# Patient Record
Sex: Female | Born: 1965
Health system: Southern US, Community
[De-identification: ages and names within clinical notes are randomized; demographics above are authoritative.]

## PROBLEM LIST (undated history)

## (undated) DIAGNOSIS — G43909 Migraine, unspecified, not intractable, without status migrainosus: Secondary | ICD-10-CM

## (undated) DIAGNOSIS — M1990 Unspecified osteoarthritis, unspecified site: Secondary | ICD-10-CM

## (undated) HISTORY — DX: Migraine, unspecified, not intractable, without status migrainosus: G43.909

## (undated) HISTORY — DX: Unspecified osteoarthritis, unspecified site: M19.90

## (undated) HISTORY — PX: COLONOSCOPY: SHX174

---

## 2001-11-03 HISTORY — PX: PARTIAL HYSTERECTOMY: SHX80

## 2002-08-18 ENCOUNTER — Ambulatory Visit (HOSPITAL_COMMUNITY): Admission: RE | Admit: 2002-08-18 | Discharge: 2002-08-18 | Payer: Self-pay | Admitting: Obstetrics

## 2002-08-18 ENCOUNTER — Encounter (INDEPENDENT_AMBULATORY_CARE_PROVIDER_SITE_OTHER): Payer: Self-pay | Admitting: *Deleted

## 2002-08-19 ENCOUNTER — Encounter: Payer: Self-pay | Admitting: Obstetrics

## 2002-08-19 ENCOUNTER — Ambulatory Visit (HOSPITAL_COMMUNITY): Admission: RE | Admit: 2002-08-19 | Discharge: 2002-08-19 | Payer: Self-pay | Admitting: Obstetrics

## 2002-09-14 ENCOUNTER — Inpatient Hospital Stay (HOSPITAL_COMMUNITY): Admission: RE | Admit: 2002-09-14 | Discharge: 2002-09-17 | Payer: Self-pay | Admitting: Obstetrics

## 2002-09-14 ENCOUNTER — Encounter (INDEPENDENT_AMBULATORY_CARE_PROVIDER_SITE_OTHER): Payer: Self-pay

## 2005-04-24 ENCOUNTER — Encounter: Admission: RE | Admit: 2005-04-24 | Discharge: 2005-04-24 | Payer: Self-pay | Admitting: Obstetrics

## 2006-09-30 ENCOUNTER — Encounter: Admission: RE | Admit: 2006-09-30 | Discharge: 2006-09-30 | Payer: Self-pay | Admitting: Obstetrics

## 2007-11-22 ENCOUNTER — Encounter: Admission: RE | Admit: 2007-11-22 | Discharge: 2007-11-22 | Payer: Self-pay | Admitting: Obstetrics

## 2007-12-01 ENCOUNTER — Encounter: Admission: RE | Admit: 2007-12-01 | Discharge: 2007-12-01 | Payer: Self-pay | Admitting: Obstetrics

## 2008-11-30 ENCOUNTER — Encounter: Admission: RE | Admit: 2008-11-30 | Discharge: 2008-11-30 | Payer: Self-pay | Admitting: Obstetrics

## 2009-12-25 ENCOUNTER — Encounter: Admission: RE | Admit: 2009-12-25 | Discharge: 2009-12-25 | Payer: Self-pay | Admitting: Obstetrics

## 2010-08-23 ENCOUNTER — Ambulatory Visit: Payer: Self-pay | Admitting: Internal Medicine

## 2010-08-23 DIAGNOSIS — M25569 Pain in unspecified knee: Secondary | ICD-10-CM | POA: Insufficient documentation

## 2010-08-23 DIAGNOSIS — G43909 Migraine, unspecified, not intractable, without status migrainosus: Secondary | ICD-10-CM | POA: Insufficient documentation

## 2010-08-23 LAB — CONVERTED CEMR LAB
Alkaline Phosphatase: 52 units/L (ref 39–117)
Basophils Absolute: 0 10*3/uL (ref 0.0–0.1)
Basophils Relative: 0.5 % (ref 0.0–3.0)
Bilirubin, Direct: 0 mg/dL (ref 0.0–0.3)
CO2: 31 meq/L (ref 19–32)
Calcium: 9.8 mg/dL (ref 8.4–10.5)
Creatinine, Ser: 1 mg/dL (ref 0.4–1.2)
Eosinophils Absolute: 0.2 10*3/uL (ref 0.0–0.7)
GFR calc non Af Amer: 81.57 mL/min (ref >60)
HDL: 60.4 mg/dL (ref >39.00)
LDL Cholesterol: 75 mg/dL (ref 0–99)
Lymphocytes Relative: 28.1 % (ref 12.0–46.0)
MCHC: 33.7 g/dL (ref 30.0–36.0)
Monocytes Relative: 6.6 % (ref 3.0–12.0)
Neutrophils Relative %: 63.3 % (ref 43.0–77.0)
RBC: 4.87 M/uL (ref 3.87–5.11)
Specific Gravity, Urine: <=1.005 (ref 1.000–1.030)
Total CHOL/HDL Ratio: 3
Total Protein, Urine: NEGATIVE mg/dL
Triglycerides: 114 mg/dL (ref 0.0–149.0)
Urine Glucose: NEGATIVE mg/dL
VLDL: 22.8 mg/dL (ref 0.0–40.0)

## 2010-12-03 NOTE — Assessment & Plan Note (Signed)
Summary: NEW/ [employer] /[place]  #   Vital Signs:  Patient profile:   45 year old female Height:      70 inches (177.80 cm) Weight:      211.0 pounds (95.91 kg) BMI:     30.38 O2 Sat:      98 % Temp:     99.3 degrees F (37.39 degrees C) oral Pulse rate:   78 / minute BP sitting:   110 / 72  (left arm) Cuff size:   large  Vitals Entered By: Orlan Leavens RMA (August 23, 2010 2:55 PM) CC: New patient Is Patient Diabetic? No Pain Assessment Patient in pain? no        Primary Care Provider:  Newt Lukes MD  CC:  New patient.  History of Present Illness: new pt to me and our practice, here to est care patient is here today for annual physical. Patient feels well today.   c/o knee pain, R>L pain located at top of knee cap pain intermittent but present most days for majority of the time onset 5 years ago - no trauma or precipitating injury symptoms improved with aleve, but doesn't like to take pills no swelling, freq popping pain worse climbing stairs  Preventive Screening-Counseling & Management  Alcohol-Tobacco     Alcohol drinks/day: 0     Alcohol Counseling: not indicated; patient does not drink     Smoking Status: never     Tobacco Counseling: not indicated; no tobacco use  Caffeine-Diet-Exercise     Does Patient Exercise: yes     Exercise Counseling: to improve exercise regimen     Depression Counseling: not indicated; screening negative for depression  Safety-Violence-Falls     Seat Belt Counseling: not indicated; patient wears seat belts     Helmet Counseling: not applicable     Firearm Counseling: not applicable     Violence Counseling: not indicated; no violence risk noted     Fall Risk Counseling: not indicated; no significant falls noted  Clinical Review Panels:  Prevention   Last Mammogram:  No specific mammographic evidence of malignancy.   (11/03/2009)   Last Pap Smear:  Interpretation/ Result:Negative for intraepithelial Lesion or Malignancy.    (11/03/2008)  Immunizations   Last Flu Vaccine:  Historical (08/03/2010)   Current Medications (verified): 1)  Vitamin D 1000 Unit Tabs (Cholecalciferol) .... 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 I have reviewed all other systems and they were negative.   Physical Exam  General:  overweight-appearing.  alert, well-developed, well-nourished, and cooperative to examination.    Head:  Normocephalic and atraumatic without obvious abnormalities. No apparent alopecia or balding. Eyes:  vision grossly intact; pupils equal, round and reactive to light.  conjunctiva and lids normal.    Ears:  normal pinnae bilaterally, without erythema, swelling, or tenderness to  palpation. TMs clear, without effusion, or cerumen impaction. Hearing grossly normal bilaterally  Mouth:  teeth and gums in good repair; mucous membranes moist, without lesions or ulcers. oropharynx clear without exudate, no erythema.  Neck:  supple, full ROM, no masses, no thyromegaly; no thyroid nodules or tenderness. no JVD or carotid bruits.   Lungs:  normal respiratory effort, no intercostal retractions or use of accessory muscles; normal breath sounds bilaterally - no crackles and no wheezes.    Heart:  normal rate, regular rhythm, no murmur, and no rub. BLE without  edema. Abdomen:  soft, non-tender, normal bowel sounds, no distention; no masses and no appreciable hepatomegaly or splenomegaly.   Genitalia:  defer gyn Msk:  both knees: full range of motion, mild boggy synovitis R>L. Tender to palpation along femoralpatellar tendon on R. Increased pain with weight bearing B. Positive crepitus B.  Neurologic:  alert & oriented X3 and cranial nerves II-XII symetrically intact.  strength normal in all extremities, sensation intact to light touch, and gait normal. speech fluent without dysarthria or aphasia; follows commands with good comprehension.  Skin:  no rashes, vesicles, ulcers, or erythema. No nodules or irregularity to palpation.  Psych:  Oriented X3, memory intact for recent and remote, normally interactive, good eye contact, not anxious appearing, mildly depressed appearing, and not agitated.      Impression & Recommendations:  Problem # 1:  PREVENTIVE HEALTH CARE (ICD-V70.0) Patient has been counseled on age-appropriate routine health concerns for screening and prevention. These are reviewed and up-to-date. Immunizations are up-to-date or declined. Labs ordered - to be reviewed. ECG declined Orders: TLB-Lipid Panel (80061-LIPID) TLB-BMP (Basic Metabolic Panel-BMET) (80048-METABOL) TLB-CBC Platelet - w/Differential (85025-CBCD) TLB-Hepatic/Liver Function Pnl (80076-HEPATIC) TLB-TSH (Thyroid Stimulating Hormone) (84443-TSH) TLB-Udip w/ Micro (81001-URINE)  Problem # 2:  KNEE PAIN, BILATERAL (ICD-719.46) R>L knee pain x 4 years - no injury femoral-patellar syndrome, ?tendonitis vs underlying atrhritis - check xray now reviewed poss med options - pt declines pills "and not shots either" - refer PT for strengthening exercises and to cont weight loss efforts at gym Orders: Physical Therapy Referral (PT) T-DG Knee 4V w/ Sunrise/Patella *L* (73564.4) T-DG Knee 4 V w/Sunrise/Patella *R* (16109.6)  Complete Medication List: 1)  Vitamin D 1000 Unit  Tabs (Cholecalciferol) .... Take 1 by mouth once daily  Patient Instructions: 1)  it was good to see you today. 2)  blood and xray test(s) ordered today - your results will be posted on the phone tree for review in 48-72 hours from the time of test completion; call [phone] and enter your 9 digit MRN (listed above on this page, just below your name); if any changes need to be made or there are abnormal results, you will be contacted directly. 3)  we'll make referral to physical therapy for your knee pain and strengthening. Our office will contact you regarding this appointment once made.  4)  ok to continue using Aleve or tylenol as needed for flares of knee pain as discussed  5)  if continued knee pain despite therapy, please call for recheck and discussion of options 6)  Please schedule a follow-up appointment annually for medical physical and labs, sooner if problems.    Orders Added: 1)  TLB-Lipid Panel [80061-LIPID] 2)  TLB-BMP (Basic Metabolic Panel-BMET) [80048-METABOL] 3)  TLB-CBC Platelet - w/Differential [85025-CBCD] 4)  TLB-Hepatic/Liver Function Pnl [80076-HEPATIC] 5)  TLB-TSH (Thyroid Stimulating Hormone) [84443-TSH] 6)  TLB-Udip w/ Micro [81001-URINE] 7)  Physical Therapy Referral [PT] 8)  T-DG Knee 4V  w/ Sunrise/Patella *L* [73564.4] 9)  T-DG Knee 4 V w/Sunrise/Patella *R* [73564.5] 10)  New Patient 40-64 years [99386] 63)  New Patient Level II [99202]   Immunization History:  Influenza Immunization History:    Influenza:  historical (08/03/2010)   Immunization History:  Influenza Immunization History:    Influenza:  Historical (08/03/2010)   Pap Smear  Procedure date:  11/03/2008  Findings:      Interpretation/ Result:Negative for intraepithelial Lesion or Malignancy.     Mammogram  Procedure date:  11/03/2009  Findings:      No specific mammographic evidence of malignancy.

## 2011-01-23 ENCOUNTER — Other Ambulatory Visit: Payer: Self-pay | Admitting: Obstetrics

## 2011-01-23 DIAGNOSIS — Z1231 Encounter for screening mammogram for malignant neoplasm of breast: Secondary | ICD-10-CM

## 2011-01-31 ENCOUNTER — Ambulatory Visit
Admission: RE | Admit: 2011-01-31 | Discharge: 2011-01-31 | Disposition: A | Discharge status: Home / Self Care | Payer: BC Managed Care – PPO | Source: Ambulatory Visit | Attending: Obstetrics | Admitting: Obstetrics

## 2011-01-31 DIAGNOSIS — Z1231 Encounter for screening mammogram for malignant neoplasm of breast: Secondary | ICD-10-CM

## 2011-02-26 ENCOUNTER — Ambulatory Visit: Payer: BC Managed Care – PPO | Admitting: Internal Medicine

## 2011-03-21 NOTE — Discharge Summary (Signed)
   NAME:  Debra Silva, Debra Silva                           ACCOUNT NO.:  [id number]   MEDICAL RECORD NO.:  [id number]                   PATIENT TYPE:  INP   LOCATION:  9303                                 FACILITY:  [facility]   PHYSICIAN:  Kathreen Cosier, M.D.           DATE OF BIRTH:  October 28, 1966   DATE OF ADMISSION:  09/14/2002  DATE OF DISCHARGE:                                 DISCHARGE SUMMARY   HOSPITAL COURSE:  The patient is a 45 year old female with fibroids, myoma  uteri, dysfunctional uterine bleeding.  Was admitted for TAH.  She underwent  TAH and bilateral salpingectomy.  Blood loss 200 cc.  Postoperatively she  did well except for a low-grade temperature.  On postoperative day #2 she  was given Cleocin 300 p.o. q.6h. and on the morning of discharge her  temperature was 100.3.  She was passing gas, incision was clean and dry, she  was asymptomatic.   DISPOSITION:  She was discharged home on Tylox one q.3-4h p.r.n. and Cleocin  300 mg p.o. q.6h. for five days.  Hemoglobin 11.4.  She was instructed that if her temperature got to 101 or greater to come  back to the hospital.   LABORATORY DATA:  On admission her hemoglobin was 13.7, white count 8.6;  postoperative hemoglobin 11.1, white count 15.9 on day #1.  PT/PTT normal.  Sodium 143, potassium 4, chloride 105, creatinine 1.  Urinalysis negative.  The patient was O positive.   DISCHARGE DIAGNOSIS:  Status post total abdominal hysterectomy/bilateral  salpingectomy for myoma uteri.                                               Kathreen Cosier, M.D.    BAM/MEDQ  D:  09/17/2002  T:  09/17/2002  Job:  [id number]

## 2011-03-21 NOTE — Op Note (Signed)
   NAME:  IZETTA, SAKAMOTO                           ACCOUNT NO.:  [id number]   MEDICAL RECORD NO.:  [id number]                   PATIENT TYPE:  AMB   LOCATION:  SDC                                  FACILITY:  [facility]   PHYSICIAN:  Kathreen Cosier, M.D.           DATE OF BIRTH:  1966/10/02   DATE OF PROCEDURE:  08/18/2002  DATE OF DISCHARGE:  08/18/2002                                 OPERATIVE REPORT   PREOPERATIVE DIAGNOSES:  Dysfunctional uterine bleeding.   PROCEDURE:  Diagnostic dilatation and curettage.  Using MAC the patient is  in lithotomy position.  Perineum and vagina prepped and draped.  Bladder  emptied with a straight catheter.  Bimanual examination revealed large  myomas.  A weighted speculum placed in the vagina.  Anterior lip of the  cervix grasped with a tenaculum.  Endometrial cavity sounded 9 cm.  Using a  small curette, the endocervix was curetted a small amount of tissue.  Cervix  was dilated to a number 29 Pratt and then the endometrial cavity curetted.  Large amount of tissue obtained.  Polyp forceps inserted.  No polyps  obtained.  The patient tolerated the procedure well.  Taken to the recovery  room in good condition.                                               Kathreen Cosier, M.D.    BAM/MEDQ  D:  10/14/2002  T:  10/14/2002  Job:  [id number]

## 2011-03-21 NOTE — Op Note (Signed)
NAME:  Debra Silva, Debra Silva                           ACCOUNT NO.:  [id number]   MEDICAL RECORD NO.:  [id number]                   PATIENT TYPE:   LOCATION:                                       FACILITY:  [facility]   PHYSICIAN:  Kathreen Cosier, M.D.           DATE OF BIRTH:  [birth year]   DATE OF PROCEDURE:  09/14/2002  DATE OF DISCHARGE:                                 OPERATIVE REPORT   PREOPERATIVE DIAGNOSES:  Leiomyoma uteri.   PROCEDURE:  Total abdominal hysterectomy, bilateral salpingectomy.   SURGEON:  Kathreen Cosier, M.D.   FIRST ASSISTANT:  Charles A. Clearance Coots, M.D.   ANESTHESIA:  General.   PROCEDURE:  The patient placed on the operating table in supine position.  General anesthesia administered.  Abdomen prepped and draped.  Bladder  emptied with Foley catheter.  Transverse suprapubic incision made.  Carried  down to the rectus fascia.  Fascia cleaned and incised the length of the  incision and recti muscles retracted laterally.  Peritoneum incised  longitudinally.  She had multiple myomas.  Ovaries normal.  Tubes previously  ligated.  Right round ligament was grasped with Kelly clamp, cut, suture  ligated with #1 chromic.  Procedure done in a similar fashion on the other  side.  Using the Metzenbaum scissors the bladder flap was dissected from the  cervix.  The right utero-ovarian ligament was grasped with a Kelly clip,  suture ligated with #1 chronic.  Procedure done in a similar fashion on the  other side.  The uterine vessels were skeletonized bilaterally, double  clamped with Heaney clamps on the right, cut, suture ligated with #1  chromic.  Procedure done in similar fashion on other side.  Straight Kocher  clamps were used to grasp the cardinal and uterosacral ligaments on the  right, cut, suture ligated with #1 chromic and done in a similar fashion  other side.  Specimen consisting of the uterus was removed at the  cervicovaginal junction.  Modified Richardson  sutures placed in the angles  of the vagina and the vaginal vault running with interlocking suture of #1  chromic.  Hemostasis was satisfactory.  The right remnant of tube was  grasped with a Kelly clamp, cut, suture ligated with #1 chronic.  Procedure  done in a similar fashion the other side.  Ovaries were left behind.  Operative site was reperitonealized with 2-0 chromic.  Lap and sponge counts  correct.  Blood loss 200 cc.  Abdomen closed in layers.  Peritoneum  continuous suture of 0 chromic.  Fascia continuous suture of 0 Dexon.  Skin  closed with subcuticular stitch of 3-0 plain.  Blood loss 200 cc.  The  patient tolerated procedure well.  Taken to recovery room in good condition.  Kathreen Cosier, M.D.    BAM/MEDQ  D:  09/14/2002  T:  09/14/2002  Job:  [id number]

## 2011-06-23 ENCOUNTER — Encounter: Payer: Self-pay | Admitting: Internal Medicine

## 2011-06-23 ENCOUNTER — Other Ambulatory Visit (HOSPITAL_COMMUNITY): Payer: Self-pay | Admitting: Obstetrics

## 2011-06-26 ENCOUNTER — Ambulatory Visit (INDEPENDENT_AMBULATORY_CARE_PROVIDER_SITE_OTHER): Payer: BC Managed Care – PPO | Admitting: Internal Medicine

## 2011-06-26 ENCOUNTER — Encounter: Payer: Self-pay | Admitting: Internal Medicine

## 2011-06-26 DIAGNOSIS — K625 Hemorrhage of anus and rectum: Secondary | ICD-10-CM

## 2011-06-26 DIAGNOSIS — R1031 Right lower quadrant pain: Secondary | ICD-10-CM

## 2011-06-26 MED ORDER — DOCUSATE SODIUM 100 MG PO CAPS: 100.0000 mg | ORAL_CAPSULE | Freq: Every day | ORAL | Status: AC | PRN

## 2011-06-26 MED ORDER — PEG-KCL-NACL-NASULF-NA ASC-C 100 G PO SOLR: 1.0000 | ORAL | Status: DC

## 2011-06-26 NOTE — Patient Instructions (Signed)
You have been scheduled for a colonoscopy instructions have been provided. Your prescriptions have been sent to the pharmacy.

## 2011-06-26 NOTE — Progress Notes (Signed)
Subjective:    Patient ID: Debra Silva, female    DOB: 1966-06-21, 45 y.o.   MRN: [id number]  HPI Debra Silva is a 45 year old female with a history of uterine fibroids status post hysterectomy who presents for evaluation of lower abdominal pain.  The patient reports one to 2 months of lower abdominal cramping which is more pronounced on the right. She reports this pain feels like a cramping or spasm. She also refers to it as "nagging". She reports it feels just like her previous uterine fibroid pain prior to her hysterectomy (hysterectomy 8 years ago). The pain occurs roughly every 3 days in the last 3-4 days without remitting. There is no specific aggravating or alleviating factor. She has not tried any medication for this pain. The pain does not radiate. There is no associated nausea or vomiting. Her appetite is good and her weight has been stable. She reports her bowel movements are now once per day for about the past year, but prior to this she was going every 2-3 days for many years. Of late she has seen some rectal bleeding with passing stool. Her bowel movements are not painful, but she does note that they're often hard. The bleeding occurs one or 2 times per month but is a change for her. She's had no fevers chills or night sweats.  Of note she notes that her son has a history of colon polyps and gets annual colonoscopy. She also notes that her son's father also had issues with colon polyps and has undergone colon resection. She is unclear as to their diagnosis.   Review of Systems ROS Constitutional: Negative for fever, chills, night sweats, activity change, appetite change and unexpected weight change HEENT: Negative for sore throat, mouth sores and trouble swallowing. Eyes: Negative for visual disturbance Respiratory: Negative for cough, chest tightness and shortness of breath Cardiovascular: Negative for chest pain, palpitations and lower extremity swelling Gastrointestinal: See  history of present illness Genitourinary: Negative for dysuria and hematuria. Musculoskeletal: + back pain, no arthralgias or myalgias Skin: Negative for rash or color change Neurological: Occasional headaches, no weakness, numbness Hematological: Negative for adenopathy, negative for easy bruising/bleeding Psychiatric/behavioral: Negative for depressed mood, negative for anxiety  Patient Active Problem List  Diagnoses  . MIGRAINE HEADACHE  . KNEE PAIN, BILATERAL    Current outpatient prescriptions:Ascorbic Acid (VITAMIN C) 1000 MG tablet, Take 1,000 mg by mouth daily.  , Disp: , Rfl: ;  cholecalciferol (VITAMIN D) 1000 UNITS tablet, Take 1,000 Units by mouth daily.  , Disp: , Rfl: ;  docusate sodium (COLACE) 100 MG capsule, Take 1 capsule (100 mg total) by mouth daily as needed for constipation., Disp: 30 capsule, Rfl: 11 peg 3350 powder (MOVIPREP) 100 G SOLR, Take 1 kit (100 g total) by mouth as directed. See written handout, Disp: 1 kit, Rfl: 0   Social History  . Marital Status: Single    Number of Children: 1   Occupational History  . [employer]    Social History Main Topics  . Smoking status: Never Smoker   . Smokeless tobacco: Never Used  . Alcohol Use: No  . Drug Use: No   Family History  Problem Relation Age of Onset  . Breast cancer Mother   . Diabetes Maternal Grandmother   . Kidney disease Father    --neg for CRC/polyps      Objective:   Physical Exam BP 120/84  Pulse 60  Ht 5\' 10"  (1.778 m)  Wt 213 lb  9.6 oz (96.888 kg)  BMI 30.65 kg/m2 Constitutional: Well-developed and well-nourished. No distress. HEENT: Normocephalic and atraumatic. Oropharynx is clear and moist. No oropharyngeal exudate. Conjunctivae are normal. Pupils are equal round and reactive to light. No scleral icterus. Neck: Neck supple. Trachea midline. Cardiovascular: Normal rate, regular rhythm and intact distal pulses. No M/R/G Pulmonary/chest: Effort normal and breath sounds normal. No  wheezing, rales or rhonchi. Abdominal: Soft, nontender, nondistended. There are no masses palpable. No hepatosplenomegaly. Lymphadenopathy: No cervical adenopathy noted. Neurological: Alert and oriented to person place and time. Skin: Skin is warm and dry. No rashes noted. Psychiatric: Normal mood and affect. Behavior is normal.    Assessment & Plan:  45 yo female with hx of uterine fibroids status post hysterectomy who presents for evaluation of lower abdominal pain.  1. Abd pain - etiology the patient's pain at this time is unclear however she is scheduled for a pelvic ultrasound tomorrow. This information could be helpful as certainly ovarian cysts or other pathology could cause lower abdominal pain.  Given her history of BRBPR I will schedule her for colonoscopy. Also I will advised that she start docusate 100 mg daily in an attempt to soften her stool. It seems that her stools are hard and certainly some level of constipation could be causing lower abdominal cramping and spasm. We can evaluate her response to this medication a colonoscopy. Followup to be determined after colonoscopy and results of upcoming ultrasound.

## 2011-06-27 ENCOUNTER — Ambulatory Visit (HOSPITAL_COMMUNITY)
Admission: RE | Admit: 2011-06-27 | Discharge: 2011-06-27 | Disposition: A | Discharge status: Home / Self Care | Payer: BC Managed Care – PPO | Source: Ambulatory Visit | Attending: Obstetrics | Admitting: Obstetrics

## 2011-06-27 DIAGNOSIS — N949 Unspecified condition associated with female genital organs and menstrual cycle: Secondary | ICD-10-CM | POA: Insufficient documentation

## 2011-06-27 DIAGNOSIS — Z90710 Acquired absence of both cervix and uterus: Secondary | ICD-10-CM | POA: Insufficient documentation

## 2011-07-03 ENCOUNTER — Other Ambulatory Visit (HOSPITAL_COMMUNITY): Payer: Self-pay | Admitting: Obstetrics

## 2011-07-03 DIAGNOSIS — N83209 Unspecified ovarian cyst, unspecified side: Secondary | ICD-10-CM

## 2011-07-22 ENCOUNTER — Encounter: Payer: Self-pay | Admitting: Internal Medicine

## 2011-07-22 ENCOUNTER — Ambulatory Visit (AMBULATORY_SURGERY_CENTER): Payer: BC Managed Care – PPO | Admitting: Internal Medicine

## 2011-07-22 DIAGNOSIS — R1031 Right lower quadrant pain: Secondary | ICD-10-CM

## 2011-07-22 DIAGNOSIS — K625 Hemorrhage of anus and rectum: Secondary | ICD-10-CM

## 2011-07-22 MED ORDER — SODIUM CHLORIDE 0.9 % IV SOLN: 500.0000 mL | INTRAVENOUS | Status: DC

## 2011-07-22 NOTE — Patient Instructions (Signed)
NORMAL COLON WITH SMALL INTERNAL HEMORRHOIDS- HANDOUT GIVEN ON HEMORRHOIDS  DISCHARGE INSTRUCTIONS GIVEN PER BLUE AND GREEN SHEETS  RETURN TO THE CARE OF YOUR PRIMARY CARE PROVIDER AND GI FOLLOW UP AS NEEDED. PLEASE CALL [phone] IF YOU HAVE ANY ISSUES ARISE OR HAVE ANY QUESTIONS.  CONTINUE COLACE ( DOCUSATE) 100 MG ONCE OR TWICE  DAILY AS NEEDED TO SOFTEN STOOLS/ AVOID CONSTIPATION.

## 2011-07-22 NOTE — Progress Notes (Signed)
Per Clide Cliff RN - Pt very nervous before procedure. Pt has hemorrhagic cyst on ovary which is probably causing pain per Dr Rhea Belton.  From 8:10 until 11:00 [employer] computer system wide down, After 11:00 wireless network still not working. Downtime procedure policy followed.  Information entered by Laverna Peace RN, procedure RN Clide Cliff, Recovery Room RN Bufford Spikes.

## 2011-07-23 ENCOUNTER — Telehealth: Payer: Self-pay

## 2011-07-23 NOTE — Telephone Encounter (Signed)
I called the hm # and the answering machine picked up with no ID on the message.  No message was left.  I then called her work # and left a message to call if any questions or concerns.  No need to call if she dose not have any. MAW

## 2011-08-22 ENCOUNTER — Ambulatory Visit (HOSPITAL_COMMUNITY): Payer: BC Managed Care – PPO

## 2011-08-22 ENCOUNTER — Ambulatory Visit (HOSPITAL_COMMUNITY)
Admission: RE | Admit: 2011-08-22 | Discharge: 2011-08-22 | Disposition: A | Discharge status: Home / Self Care | Payer: BC Managed Care – PPO | Source: Ambulatory Visit | Attending: Obstetrics | Admitting: Obstetrics

## 2011-08-22 DIAGNOSIS — Z90710 Acquired absence of both cervix and uterus: Secondary | ICD-10-CM | POA: Insufficient documentation

## 2011-08-22 DIAGNOSIS — N949 Unspecified condition associated with female genital organs and menstrual cycle: Secondary | ICD-10-CM | POA: Insufficient documentation

## 2011-08-22 DIAGNOSIS — N83209 Unspecified ovarian cyst, unspecified side: Secondary | ICD-10-CM | POA: Insufficient documentation

## 2012-03-01 ENCOUNTER — Other Ambulatory Visit: Payer: Self-pay | Admitting: Obstetrics

## 2012-03-01 DIAGNOSIS — Z1231 Encounter for screening mammogram for malignant neoplasm of breast: Secondary | ICD-10-CM

## 2012-03-12 ENCOUNTER — Ambulatory Visit
Admission: RE | Admit: 2012-03-12 | Discharge: 2012-03-12 | Disposition: A | Discharge status: Home / Self Care | Payer: BC Managed Care – PPO | Source: Ambulatory Visit | Attending: Obstetrics | Admitting: Obstetrics

## 2012-03-12 DIAGNOSIS — Z1231 Encounter for screening mammogram for malignant neoplasm of breast: Secondary | ICD-10-CM

## 2013-03-07 ENCOUNTER — Other Ambulatory Visit: Payer: Self-pay

## 2013-03-07 DIAGNOSIS — Z1231 Encounter for screening mammogram for malignant neoplasm of breast: Secondary | ICD-10-CM

## 2013-04-12 ENCOUNTER — Ambulatory Visit
Admission: RE | Admit: 2013-04-12 | Discharge: 2013-04-12 | Disposition: A | Discharge status: Home / Self Care | Payer: BC Managed Care – PPO | Source: Ambulatory Visit

## 2013-04-12 DIAGNOSIS — Z1231 Encounter for screening mammogram for malignant neoplasm of breast: Secondary | ICD-10-CM

## 2013-10-12 ENCOUNTER — Encounter: Payer: Self-pay | Admitting: Internal Medicine

## 2013-10-12 ENCOUNTER — Ambulatory Visit (INDEPENDENT_AMBULATORY_CARE_PROVIDER_SITE_OTHER): Payer: BC Managed Care – PPO | Admitting: Internal Medicine

## 2013-10-12 ENCOUNTER — Other Ambulatory Visit (INDEPENDENT_AMBULATORY_CARE_PROVIDER_SITE_OTHER): Payer: BC Managed Care – PPO

## 2013-10-12 VITALS — BP 110/82 | HR 75 | Temp 98.2°F | Ht 68.0 in | Wt 227.0 lb

## 2013-10-12 DIAGNOSIS — Z0000 Encounter for general adult medical examination without abnormal findings: Secondary | ICD-10-CM

## 2013-10-12 DIAGNOSIS — Z23 Encounter for immunization: Secondary | ICD-10-CM

## 2013-10-12 DIAGNOSIS — E669 Obesity, unspecified: Secondary | ICD-10-CM

## 2013-10-12 LAB — BASIC METABOLIC PANEL
CO2: 29 mEq/L (ref 19–32)
Calcium: 9.5 mg/dL (ref 8.4–10.5)
Chloride: 101 mEq/L (ref 96–112)
Glucose, Bld: 89 mg/dL (ref 70–99)
Sodium: 138 mEq/L (ref 135–145)

## 2013-10-12 LAB — HEPATIC FUNCTION PANEL
AST: 23 U/L (ref 0–37)
Albumin: 4.1 g/dL (ref 3.5–5.2)
Alkaline Phosphatase: 57 U/L (ref 39–117)
Total Bilirubin: 0.5 mg/dL (ref 0.3–1.2)

## 2013-10-12 LAB — LIPID PANEL
HDL: 61.2 mg/dL (ref >39.00)
LDL Cholesterol: 65 mg/dL (ref 0–99)
Total CHOL/HDL Ratio: 2

## 2013-10-12 LAB — URINALYSIS, ROUTINE W REFLEX MICROSCOPIC
Bilirubin Urine: NEGATIVE
Ketones, ur: NEGATIVE
Leukocytes, UA: NEGATIVE
Urine Glucose: NEGATIVE
Urobilinogen, UA: 0.2 (ref 0.0–1.0)

## 2013-10-12 LAB — CBC WITH DIFFERENTIAL/PLATELET
Basophils Absolute: 0 10*3/uL (ref 0.0–0.1)
Basophils Relative: 0.3 % (ref 0.0–3.0)
Eosinophils Absolute: 0.2 10*3/uL (ref 0.0–0.7)
Hemoglobin: 13.5 g/dL (ref 12.0–15.0)
Lymphocytes Relative: 32.3 % (ref 12.0–46.0)
Lymphs Abs: 3.2 10*3/uL (ref 0.7–4.0)
MCHC: 33.1 g/dL (ref 30.0–36.0)
MCV: 78.9 fl (ref 78.0–100.0)
Monocytes Absolute: 0.6 10*3/uL (ref 0.1–1.0)
Neutro Abs: 5.9 10*3/uL (ref 1.4–7.7)
RBC: 5.16 Mil/uL — ABNORMAL HIGH (ref 3.87–5.11)
RDW: 14.2 % (ref 11.5–14.6)

## 2013-10-12 NOTE — Patient Instructions (Addendum)
It was good to see you today.  We have reviewed your prior records including labs and tests today  Health Maintenance reviewed - all recommended immunizations and age-appropriate screenings are up-to-date.  Test(s) ordered today. Your results will be released to MyChart (or called to you) after review, usually within 72hours after test completion. If any changes need to be made, you will be notified at that same time.  Medications reviewed and updated, no changes recommended at this time.  Work on lifestyle changes as discussed (low fat, low carb, increased protein diet; improved exercise efforts; weight loss) to control sugar, blood pressure and cholesterol levels and/or reduce risk of developing other medical problems. Look into [email or web address] or other type of food journal to assist you in this process.  Please schedule followup in 12-18 months, call sooner if problems.  Health Maintenance, Female A healthy lifestyle and preventative care can promote health and wellness.  Maintain regular health, dental, and eye exams.  Eat a healthy diet. Foods like vegetables, fruits, whole grains, low-fat dairy products, and lean protein foods contain the nutrients you need without too many calories. Decrease your intake of foods high in solid fats, added sugars, and salt. Get information about a proper diet from your caregiver, if necessary.  Regular physical exercise is one of the most important things you can do for your health. Most adults should get at least 150 minutes of moderate-intensity exercise (any activity that increases your heart rate and causes you to sweat) each week. In addition, most adults need muscle-strengthening exercises on 2 or more days a week.   Maintain a healthy weight. The body mass index (BMI) is a screening tool to identify possible weight problems. It provides an estimate of body fat based on height and weight. Your caregiver can help determine your BMI, and can help  you achieve or maintain a healthy weight. For adults 20 years and older:  A BMI below 18.5 is considered underweight.  A BMI of 18.5 to 24.9 is normal.  A BMI of 25 to 29.9 is considered overweight.  A BMI of 30 and above is considered obese.  Maintain normal blood lipids and cholesterol by exercising and minimizing your intake of saturated fat. Eat a balanced diet with plenty of fruits and vegetables. Blood tests for lipids and cholesterol should begin at age 31 and be repeated every 5 years. If your lipid or cholesterol levels are high, you are over 50, or you are a high risk for heart disease, you may need your cholesterol levels checked more frequently.Ongoing high lipid and cholesterol levels should be treated with medicines if diet and exercise are not effective.  If you smoke, find out from your caregiver how to quit. If you do not use tobacco, do not start.  Lung cancer screening is recommended for adults aged 47 80 years who are at high risk for developing lung cancer because of a history of smoking. Yearly low-dose computed tomography (CT) is recommended for people who have at least a 30-pack-year history of smoking and are a current smoker or have quit within the past 15 years. A pack year of smoking is smoking an average of 1 pack of cigarettes a day for 1 year (for example: 1 pack a day for 30 years or 2 packs a day for 15 years). Yearly screening should continue until the smoker has stopped smoking for at least 15 years. Yearly screening should also be stopped for people who develop a health problem that would  prevent them from having lung cancer treatment.  If you are pregnant, do not drink alcohol. If you are breastfeeding, be very cautious about drinking alcohol. If you are not pregnant and choose to drink alcohol, do not exceed 1 drink per day. One drink is considered to be 12 ounces (355 mL) of beer, 5 ounces (148 mL) of wine, or 1.5 ounces (44 mL) of liquor.  Avoid use of  street drugs. Do not share needles with anyone. Ask for help if you need support or instructions about stopping the use of drugs.  High blood pressure causes heart disease and increases the risk of stroke. Blood pressure should be checked at least every 1 to 2 years. Ongoing high blood pressure should be treated with medicines, if weight loss and exercise are not effective.  If you are 59 to 46 years old, ask your caregiver if you should take aspirin to prevent strokes.  Diabetes screening involves taking a blood sample to check your fasting blood sugar level. This should be done once every 3 years, after age 32, if you are within normal weight and without risk factors for diabetes. Testing should be considered at a younger age or be carried out more frequently if you are overweight and have at least 1 risk factor for diabetes.  Breast cancer screening is essential preventative care for women. You should practice "breast self-awareness." This means understanding the normal appearance and feel of your breasts and may include breast self-examination. Any changes detected, no matter how small, should be reported to a caregiver. Women in their 69s and 30s should have a clinical breast exam (CBE) by a caregiver as part of a regular health exam every 1 to 3 years. After age 64, women should have a CBE every year. Starting at age 50, women should consider having a mammogram (breast X-ray) every year. Women who have a family history of breast cancer should talk to their caregiver about genetic screening. Women at a high risk of breast cancer should talk to their caregiver about having an MRI and a mammogram every year.  Breast cancer gene (BRCA)-related cancer risk assessment is recommended for women who have family members with BRCA-related cancers. BRCA-related cancers include breast, ovarian, tubal, and peritoneal cancers. Having family members with these cancers may be associated with an increased risk for  harmful changes (mutations) in the breast cancer genes BRCA1 and BRCA2. Results of the assessment will determine the need for genetic counseling and BRCA1 and BRCA2 testing.  The Pap test is a screening test for cervical cancer. Women should have a Pap test starting at age 9. Between ages 72 and 80, Pap tests should be repeated every 2 years. Beginning at age 73, you should have a Pap test every 3 years as long as the past 3 Pap tests have been normal. If you had a hysterectomy for a problem that was not cancer or a condition that could lead to cancer, then you no longer need Pap tests. If you are between ages 25 and 77, and you have had normal Pap tests going back 10 years, you no longer need Pap tests. If you have had past treatment for cervical cancer or a condition that could lead to cancer, you need Pap tests and screening for cancer for at least 20 years after your treatment. If Pap tests have been discontinued, risk factors (such as a new sexual partner) need to be reassessed to determine if screening should be resumed. Some women have medical  problems that increase the chance of getting cervical cancer. In these cases, your caregiver may recommend more frequent screening and Pap tests.  The human papillomavirus (HPV) test is an additional test that may be used for cervical cancer screening. The HPV test looks for the virus that can cause the cell changes on the cervix. The cells collected during the Pap test can be tested for HPV. The HPV test could be used to screen women aged 44 years and older, and should be used in women of any age who have unclear Pap test results. After the age of 77, women should have HPV testing at the same frequency as a Pap test.  Colorectal cancer can be detected and often prevented. Most routine colorectal cancer screening begins at the age of 44 and continues through age 38. However, your caregiver may recommend screening at an earlier age if you have risk factors for  colon cancer. On a yearly basis, your caregiver may provide home test kits to check for hidden blood in the stool. Use of a small camera at the end of a tube, to directly examine the colon (sigmoidoscopy or colonoscopy), can detect the earliest forms of colorectal cancer. Talk to your caregiver about this at age 62, when routine screening begins. Direct examination of the colon should be repeated every 5 to 10 years through age 49, unless early forms of pre-cancerous polyps or small growths are found.  Hepatitis C blood testing is recommended for all people born from [birth year] through 1964 and any individual with known risks for hepatitis C.  Practice safe sex. Use condoms and avoid high-risk sexual practices to reduce the spread of sexually transmitted infections (STIs). Sexually active women aged 103 and younger should be checked for Chlamydia, which is a common sexually transmitted infection. Older women with new or multiple partners should also be tested for Chlamydia. Testing for other STIs is recommended if you are sexually active and at increased risk.  Osteoporosis is a disease in which the bones lose minerals and strength with aging. This can result in serious bone fractures. The risk of osteoporosis can be identified using a bone density scan. Women ages 76 and over and women at risk for fractures or osteoporosis should discuss screening with their caregivers. Ask your caregiver whether you should be taking a calcium supplement or vitamin D to reduce the rate of osteoporosis.  Menopause can be associated with physical symptoms and risks. Hormone replacement therapy is available to decrease symptoms and risks. You should talk to your caregiver about whether hormone replacement therapy is right for you.  Use sunscreen. Apply sunscreen liberally and repeatedly throughout the day. You should seek shade when your shadow is shorter than you. Protect yourself by wearing long sleeves, pants, a wide-brimmed  hat, and sunglasses year round, whenever you are outdoors.  Notify your caregiver of new moles or changes in moles, especially if there is a change in shape or color. Also notify your caregiver if a mole is larger than the size of a pencil eraser.  Stay current with your immunizations. Document Released: 05/05/2011 Document Revised: 02/14/2013 Document Reviewed: 05/05/2011 [facility] Patient Information 2014 [place], [place]. Exercise to Lose Weight Exercise and a healthy diet may help you lose weight. Your doctor may suggest specific exercises. EXERCISE IDEAS AND TIPS  Choose low-cost things you enjoy doing, such as walking, bicycling, or exercising to workout videos.  Take stairs instead of the elevator.  Walk during your lunch break.  Park your car  further away from work or school.  Go to a gym or an exercise class.  Start with 5 to 10 minutes of exercise each day. Build up to 30 minutes of exercise 4 to 6 days a week.  Wear shoes with good support and comfortable clothes.  Stretch before and after working out.  Work out until you breathe harder and your heart beats faster.  Drink extra water when you exercise.  Do not do so much that you hurt yourself, feel dizzy, or get very short of breath. Exercises that burn about 150 calories:  Running 1  miles in 15 minutes.  Playing volleyball for 45 to 60 minutes.  Washing and waxing a car for 45 to 60 minutes.  Playing touch football for 45 minutes.  Walking 1  miles in 35 minutes.  Pushing a stroller 1  miles in 30 minutes.  Playing basketball for 30 minutes.  Raking leaves for 30 minutes.  Bicycling 5 miles in 30 minutes.  Walking 2 miles in 30 minutes.  Dancing for 30 minutes.  Shoveling snow for 15 minutes.  Swimming laps for 20 minutes.  Walking up stairs for 15 minutes.  Bicycling 4 miles in 15 minutes.  Gardening for 30 to 45 minutes.  Jumping rope for 15 minutes.  Washing windows or floors for  45 to 60 minutes. Document Released: 11/22/2010 Document Revised: 01/12/2012 Document Reviewed: 11/22/2010 [facility] Patient Information 2014 [place], [place].

## 2013-10-12 NOTE — Assessment & Plan Note (Signed)
Wt Readings from Last 3 Encounters:  10/12/13 227 lb (102.967 kg)  07/22/11 213 lb (96.616 kg)  06/26/11 213 lb 9.6 oz (96.888 kg)   The patient is asked to make an attempt to improve diet and exercise patterns to aid in medical management of this problem.

## 2013-10-12 NOTE — Progress Notes (Signed)
   Subjective:    Patient ID: Debra Silva, female    DOB: [birth year], 47 y.o.   MRN: [id number]  HPI  "New" patient - last seen 08/2010  patient is here today for annual physical. Patient feels well and has no complaints.  Past Medical History  Diagnosis Date  . Migraine    Family History  Problem Relation Age of Onset  . Breast cancer Mother 73  . Diabetes Maternal Grandmother   . Kidney disease Father   . ALS Maternal Grandmother 24  . Stroke Father   . Hypertension Brother    History  Substance Use Topics  . Smoking status: Never Smoker   . Smokeless tobacco: Never Used  . Alcohol Use: No    Review of Systems  Constitutional: Negative for fatigue and unexpected weight change.  Respiratory: Negative for cough, shortness of breath and wheezing.   Cardiovascular: Negative for chest pain, palpitations and leg swelling.  Gastrointestinal: Negative for nausea, abdominal pain and diarrhea.  Neurological: Negative for dizziness, weakness, light-headedness and headaches.  Psychiatric/Behavioral: Negative for dysphoric mood. The patient is not nervous/anxious.   All other systems reviewed and are negative.       Objective:   Physical Exam BP 110/82  Pulse 75  Temp(Src) 98.2 F (36.8 C) (Oral)  Ht 5\' 8"  (1.727 m)  Wt 227 lb (102.967 kg)  BMI 34.52 kg/m2  SpO2 97% Wt Readings from Last 3 Encounters:  10/12/13 227 lb (102.967 kg)  07/22/11 213 lb (96.616 kg)  06/26/11 213 lb 9.6 oz (96.888 kg)   Constitutional: She is overweight, but appears well-developed and well-nourished. No distress.  HENT: Head: Normocephalic and atraumatic. Ears: B TMs ok, no erythema or effusion; Nose: Nose normal. Mouth/Throat: Oropharynx is clear and moist. No oropharyngeal exudate.  Eyes: Conjunctivae and EOM are normal. Pupils are equal, round, and reactive to light. No scleral icterus.  Neck: Normal range of motion. Neck supple. No JVD present. No thyromegaly present.  Cardiovascular:  Normal rate, regular rhythm and normal heart sounds.  No murmur heard. No BLE edema. Pulmonary/Chest: Effort normal and breath sounds normal. No respiratory distress. She has no wheezes.  Abdominal: Soft. Bowel sounds are normal. She exhibits no distension. There is no tenderness. no masses Musculoskeletal: Normal range of motion, no joint effusions. No gross deformities Neurological: She is alert and oriented to person, place, and time. No cranial nerve deficit. Coordination, balance, strength, speech and gait are normal.  Skin: Skin is warm and dry. No rash noted. No erythema.  Psychiatric: She has a normal mood and affect. Her behavior is normal. Judgment and thought content normal.   Lab Results  Component Value Date   WBC 10.0 08/23/2010   HGB 13.3 08/23/2010   HCT 39.5 08/23/2010   PLT 245.0 08/23/2010   GLUCOSE 85 08/23/2010   CHOL 158 08/23/2010   TRIG 114.0 08/23/2010   HDL 60.40 08/23/2010   LDLCALC 75 08/23/2010   ALT 12 08/23/2010   AST 16 08/23/2010   NA 140 08/23/2010   K 5.0 08/23/2010   CL 104 08/23/2010   CREATININE 1.0 08/23/2010   BUN 7 08/23/2010   CO2 31 08/23/2010   TSH 1.82 08/23/2010        Assessment & Plan:   CPX/v70.0 - Patient has been counseled on age-appropriate routine health concerns for screening and prevention. These are reviewed and up-to-date. Immunizations are up-to-date or declined. Labs ordered and reviewed.

## 2013-10-12 NOTE — Progress Notes (Signed)
Pre-visit discussion using our clinic review tool. No additional management support is needed unless otherwise documented below in the visit note.  

## 2013-10-17 LAB — HM PAP SMEAR

## 2013-11-08 ENCOUNTER — Ambulatory Visit: Payer: BC Managed Care – PPO

## 2013-11-08 ENCOUNTER — Ambulatory Visit (INDEPENDENT_AMBULATORY_CARE_PROVIDER_SITE_OTHER): Payer: BC Managed Care – PPO | Admitting: Emergency Medicine

## 2013-11-08 VITALS — BP 136/80 | HR 91 | Temp 98.1°F | Resp 18 | Ht 68.0 in | Wt 224.0 lb

## 2013-11-08 DIAGNOSIS — M25551 Pain in right hip: Secondary | ICD-10-CM

## 2013-11-08 DIAGNOSIS — M25559 Pain in unspecified hip: Secondary | ICD-10-CM

## 2013-11-08 MED ORDER — NAPROXEN SODIUM 550 MG PO TABS: 550.0000 mg | ORAL_TABLET | Freq: Two times a day (BID) | ORAL | Status: DC

## 2013-11-08 MED ORDER — ACETAMINOPHEN-CODEINE #3 300-30 MG PO TABS: 1.0000 | ORAL_TABLET | ORAL | Status: DC | PRN

## 2013-11-08 NOTE — Progress Notes (Signed)
[facility] [address] [place] [address] [phone]  Date:  11/08/2013   Name:  Debra Silva   DOB:  1966-01-30   MRN:  [id number]  PCP:  Gwendolyn Grant, MD    Chief Complaint: Hip Pain   History of Present Illness:  Debra Silva is a 48 y.o. very pleasant female patient who presents with the following:  Has pain in right hip for two day.  No history of injury or overuse. No antecedent infection or fever or chills.  Pain increases with standing and walking.  Can't lay on that side. No improvement with over the counter medications or other home remedies. Denies other complaint or health concern today. Works out regularly and walks a treadmil 4 hours weekly  Patient Active Problem List   Diagnosis Date Noted  . Obesity (BMI 30-39.9) 10/12/2013  . MIGRAINE HEADACHE 08/23/2010  . KNEE PAIN, BILATERAL 08/23/2010    Past Medical History  Diagnosis Date  . Migraine     Past Surgical History  Procedure Laterality Date  . Partial hysterectomy    . Abdominal hysterectomy      History  Substance Use Topics  . Smoking status: Never Smoker   . Smokeless tobacco: Never Used  . Alcohol Use: No    Family History  Problem Relation Age of Onset  . Breast cancer Mother 7  . Diabetes Maternal Grandmother   . Kidney disease Father   . ALS Maternal Grandmother 83  . Stroke Father   . Hypertension Brother     No Known Allergies  Medication list has been reviewed and updated.  Current Outpatient Prescriptions on File Prior to Visit  Medication Sig Dispense Refill  . Cholecalciferol (VITAMIN D) 2000 UNITS CAPS Take 1 capsule by mouth daily.       No current facility-administered medications on file prior to visit.    Review of Systems:  As per HPI, otherwise negative.    Physical Examination: Filed Vitals:   11/08/13 1053  BP: 136/80  Pulse: 91  Temp: 98.1 F (36.7 C)  Resp: 18   Filed Vitals:   11/08/13 1053  Height: 5\' 8"   (1.727 m)  Weight: 224 lb (101.606 kg)   Body mass index is 34.07 kg/(m^2). Ideal Body Weight: Weight in (lb) to have BMI = 25: 164.1   GEN: WDWN, NAD, Non-toxic, Alert & Oriented x 3 HEENT: Atraumatic, Normocephalic.  Ears and Nose: No external deformity. EXTR: No clubbing/cyanosis/edema NEURO: Normal gait.  PSYCH: Normally interactive. Conversant. Not depressed or anxious appearing.  Calm demeanor.  RIGHT hip:  No tenderness.  Guards with movement, flexion and internal and external rotation.  Assessment and Plan: Right hip strain T#3 Anaprox   Signed,  Ellison Carwin, MD  [facility] reading (PRIMARY) by  Dr. Ouida Sills  Negative hip.

## 2014-03-16 ENCOUNTER — Other Ambulatory Visit: Payer: Self-pay

## 2014-03-16 DIAGNOSIS — Z1231 Encounter for screening mammogram for malignant neoplasm of breast: Secondary | ICD-10-CM

## 2014-04-14 ENCOUNTER — Ambulatory Visit
Admission: RE | Admit: 2014-04-14 | Discharge: 2014-04-14 | Disposition: A | Discharge status: Home / Self Care | Payer: BC Managed Care – PPO | Source: Ambulatory Visit

## 2014-04-14 DIAGNOSIS — Z1231 Encounter for screening mammogram for malignant neoplasm of breast: Secondary | ICD-10-CM

## 2014-10-25 ENCOUNTER — Ambulatory Visit (INDEPENDENT_AMBULATORY_CARE_PROVIDER_SITE_OTHER): Payer: BC Managed Care – PPO | Admitting: Internal Medicine

## 2014-10-25 ENCOUNTER — Other Ambulatory Visit (INDEPENDENT_AMBULATORY_CARE_PROVIDER_SITE_OTHER): Payer: BC Managed Care – PPO

## 2014-10-25 ENCOUNTER — Encounter: Payer: Self-pay | Admitting: Internal Medicine

## 2014-10-25 VITALS — BP 120/84 | HR 77 | Temp 98.0°F | Ht 68.75 in | Wt 231.0 lb

## 2014-10-25 DIAGNOSIS — E669 Obesity, unspecified: Secondary | ICD-10-CM

## 2014-10-25 DIAGNOSIS — Z0000 Encounter for general adult medical examination without abnormal findings: Secondary | ICD-10-CM

## 2014-10-25 LAB — BASIC METABOLIC PANEL
BUN: 7 mg/dL (ref 6–23)
CALCIUM: 9.8 mg/dL (ref 8.4–10.5)
CO2: 26 meq/L (ref 19–32)
CREATININE: 1 mg/dL (ref 0.4–1.2)
Chloride: 103 mEq/L (ref 96–112)
GFR: 72.99 mL/min (ref >60.00)
Glucose, Bld: 90 mg/dL (ref 70–99)
Potassium: 4.9 mEq/L (ref 3.5–5.1)
Sodium: 138 mEq/L (ref 135–145)

## 2014-10-25 LAB — LIPID PANEL
CHOL/HDL RATIO: 3
Cholesterol: 188 mg/dL (ref 0–200)
HDL: 58.3 mg/dL (ref >39.00)
LDL CALC: 112 mg/dL — AB (ref 0–99)
NONHDL: 129.7
Triglycerides: 87 mg/dL (ref 0.0–149.0)
VLDL: 17.4 mg/dL (ref 0.0–40.0)

## 2014-10-25 LAB — URINALYSIS, ROUTINE W REFLEX MICROSCOPIC
BILIRUBIN URINE: NEGATIVE
KETONES UR: NEGATIVE
Leukocytes, UA: NEGATIVE
Nitrite: NEGATIVE
Specific Gravity, Urine: <=1.005 — AB (ref 1.000–1.030)
TOTAL PROTEIN, URINE-UPE24: NEGATIVE
URINE GLUCOSE: NEGATIVE
UROBILINOGEN UA: 0.2 (ref 0.0–1.0)
pH: 6 (ref 5.0–8.0)

## 2014-10-25 LAB — CBC WITH DIFFERENTIAL/PLATELET
BASOS PCT: 0.3 % (ref 0.0–3.0)
Basophils Absolute: 0 10*3/uL (ref 0.0–0.1)
EOS ABS: 0.1 10*3/uL (ref 0.0–0.7)
Eosinophils Relative: 0.8 % (ref 0.0–5.0)
HCT: 46 % (ref 36.0–46.0)
Hemoglobin: 14.9 g/dL (ref 12.0–15.0)
LYMPHS PCT: 32.4 % (ref 12.0–46.0)
Lymphs Abs: 3.3 10*3/uL (ref 0.7–4.0)
MCHC: 32.4 g/dL (ref 30.0–36.0)
MCV: 79.9 fl (ref 78.0–100.0)
Monocytes Absolute: 0.6 10*3/uL (ref 0.1–1.0)
Monocytes Relative: 6.1 % (ref 3.0–12.0)
NEUTROS PCT: 60.4 % (ref 43.0–77.0)
Neutro Abs: 6.1 10*3/uL (ref 1.4–7.7)
PLATELETS: 294 10*3/uL (ref 150.0–400.0)
RBC: 5.75 Mil/uL — AB (ref 3.87–5.11)
RDW: 14.3 % (ref 11.5–15.5)
WBC: 10 10*3/uL (ref 4.0–10.5)

## 2014-10-25 LAB — HEPATIC FUNCTION PANEL
ALK PHOS: 63 U/L (ref 39–117)
ALT: 15 U/L (ref 0–35)
AST: 16 U/L (ref 0–37)
Albumin: 4.3 g/dL (ref 3.5–5.2)
BILIRUBIN DIRECT: 0.1 mg/dL (ref 0.0–0.3)
BILIRUBIN TOTAL: 0.2 mg/dL (ref 0.2–1.2)
TOTAL PROTEIN: 7.8 g/dL (ref 6.0–8.3)

## 2014-10-25 LAB — TSH: TSH: 4.25 u[IU]/mL (ref 0.35–4.50)

## 2014-10-25 NOTE — Progress Notes (Signed)
Subjective:    Patient ID: Debra Silva, female    DOB: 12-21-1965, 48 y.o.   MRN: [id number]  HPI  patient is here today for annual physical. Patient feels well and has no complaints.  Also reviewed chronic medical issues and interval medical events  Past Medical History  Diagnosis Date  . Migraine     hx of, resolved   Family History  Problem Relation Age of Onset  . Breast cancer Mother 63  . Diabetes Maternal Grandmother   . Kidney disease Father   . ALS Maternal Grandmother 83  . Stroke Father   . Hypertension Brother    History  Substance Use Topics  . Smoking status: Never Smoker   . Smokeless tobacco: Never Used  . Alcohol Use: No    Review of Systems  Constitutional: Negative for fatigue and unexpected weight change.  Respiratory: Negative for cough, shortness of breath and wheezing.   Cardiovascular: Negative for chest pain, palpitations and leg swelling.  Gastrointestinal: Negative for nausea, abdominal pain and diarrhea.  Neurological: Negative for dizziness, weakness, light-headedness and headaches.  Psychiatric/Behavioral: Negative for dysphoric mood. The patient is not nervous/anxious.   All other systems reviewed and are negative.      Objective:   Physical Exam  BP 120/84 mmHg  Pulse 77  Temp(Src) 98 F (36.7 C) (Oral)  Ht 5' 8.75" (1.746 m)  Wt 231 lb (104.781 kg)  BMI 34.37 kg/m2  SpO2 99% Wt Readings from Last 3 Encounters:  10/25/14 231 lb (104.781 kg)  11/08/13 224 lb (101.606 kg)  10/12/13 227 lb (102.967 kg)   Constitutional: She is obese, appears well-developed and well-nourished. No distress.  HENT: Head: Normocephalic and atraumatic. Ears: B TMs ok, no erythema or effusion; Nose: Nose normal. Mouth/Throat: Oropharynx is clear and moist. No oropharyngeal exudate.  Eyes: Conjunctivae and EOM are normal. Pupils are equal, round, and reactive to light. No scleral icterus.  Neck: Normal range of motion. Neck supple. No JVD  present. No thyromegaly present.  Cardiovascular: Normal rate, regular rhythm and normal heart sounds.  No murmur heard. No BLE edema. Pulmonary/Chest: Effort normal and breath sounds normal. No respiratory distress. She has no wheezes.  Abdominal: Soft. Bowel sounds are normal. She exhibits no distension. There is no tenderness. no masses GU/breast: defer to gyn Musculoskeletal: Normal range of motion, no joint effusions. No gross deformities Neurological: She is alert and oriented to person, place, and time. No cranial nerve deficit. Coordination, balance, strength, speech and gait are normal.  Skin: Skin is warm and dry. No rash noted. No erythema.  Psychiatric: She has a normal mood and affect. Her behavior is normal. Judgment and thought content normal.    Lab Results  Component Value Date   WBC 9.9 10/12/2013   HGB 13.5 10/12/2013   HCT 40.8 10/12/2013   PLT 241.0 10/12/2013   GLUCOSE 89 10/12/2013   CHOL 152 10/12/2013   TRIG 128.0 10/12/2013   HDL 61.20 10/12/2013   LDLCALC 65 10/12/2013   ALT 25 10/12/2013   AST 23 10/12/2013   NA 138 10/12/2013   K 4.1 10/12/2013   CL 101 10/12/2013   CREATININE 1.1 10/12/2013   BUN 9 10/12/2013   CO2 29 10/12/2013   TSH 1.57 10/12/2013    Mm Screening Breast Tomo Bilateral  04/18/2014   CLINICAL DATA:  Screening.  EXAM: DIGITAL SCREENING BILATERAL MAMMOGRAM WITH 3D TOMO WITH CAD  COMPARISON:  Previous exam(s).  ACR Breast Density Category d:  The breast tissue is extremely dense, which lowers the sensitivity of mammography.  FINDINGS: There are no findings suspicious for malignancy. Images were processed with CAD.  IMPRESSION: No mammographic evidence of malignancy. A result letter of this screening mammogram will be mailed directly to the patient.  RECOMMENDATION: Screening mammogram in one year. (Code:SM-B-01Y)  BI-RADS CATEGORY  1: Negative.   Electronically Signed   By: Conchita Paris M.D.   On: 04/18/2014 14:03       Assessment &  Plan:   CPX/z00.00 - Patient has been counseled on age-appropriate routine health concerns for screening and prevention. These are reviewed and up-to-date. Immunizations are up-to-date or declined. Labs reviewed.  Problem List Items Addressed This Visit    Obesity (BMI 30-39.9)    Wt Readings from Last 3 Encounters:  10/25/14 231 lb (104.781 kg)  11/08/13 224 lb (101.606 kg)  10/12/13 227 lb (102.967 kg)   The patient is asked to make an attempt to improve diet and exercise patterns to aid in medical management of this problem.     Other Visit Diagnoses    Routine general medical examination at a health care facility    -  Primary    Relevant Orders       Basic metabolic panel       CBC with Differential       Hepatic function panel       Lipid panel       TSH       Urinalysis, Routine w reflex microscopic

## 2014-10-25 NOTE — Progress Notes (Signed)
Pre visit review using our clinic review tool, if applicable. No additional management support is needed unless otherwise documented below in the visit note. 

## 2014-10-25 NOTE — Patient Instructions (Addendum)
It was good to see you today.  We have reviewed your prior records including labs and tests today  Health Maintenance reviewed - all recommended immunizations and age-appropriate screenings are up-to-date.  Test(s) ordered today. Your results will be released to [place] (or called to you) after review, usually within 72hours after test completion. If any changes need to be made, you will be notified at that same time.  Medications reviewed and updated, no changes recommended at this time.  Please schedule followup in 12 months for annual exam and labs, call sooner if problems.  Health Maintenance Adopting a healthy lifestyle and getting preventive care can go a long way to promote health and wellness. Talk with your health care provider about what schedule of regular examinations is right for you. This is a good chance for you to check in with your provider about disease prevention and staying healthy. In between checkups, there are plenty of things you can do on your own. Experts have done a lot of research about which lifestyle changes and preventive measures are most likely to keep you healthy. Ask your health care provider for more information. WEIGHT AND DIET  Eat a healthy diet  Be sure to include plenty of vegetables, fruits, low-fat dairy products, and lean protein.  Do not eat a lot of foods high in solid fats, added sugars, or salt.  Get regular exercise. This is one of the most important things you can do for your health.  Most adults should exercise for at least 150 minutes each week. The exercise should increase your heart rate and make you sweat (moderate-intensity exercise).  Most adults should also do strengthening exercises at least twice a week. This is in addition to the moderate-intensity exercise.  Maintain a healthy weight  Body mass index (BMI) is a measurement that can be used to identify possible weight problems. It estimates body fat based on height and weight.  Your health care provider can help determine your BMI and help you achieve or maintain a healthy weight.  For females 20 years of age and older:   A BMI below 18.5 is considered underweight.  A BMI of 18.5 to 24.9 is normal.  A BMI of 25 to 29.9 is considered overweight.  A BMI of 30 and above is considered obese.  Watch levels of cholesterol and blood lipids  You should start having your blood tested for lipids and cholesterol at 47 years of age, then have this test every 5 years.  You may need to have your cholesterol levels checked more often if:  Your lipid or cholesterol levels are high.  You are older than 47 years of age.  You are at high risk for heart disease.  CANCER SCREENING   Lung Cancer  Lung cancer screening is recommended for adults 1-59 years old who are at high risk for lung cancer because of a history of smoking.  A yearly low-dose CT scan of the lungs is recommended for people who:  Currently smoke.  Have quit within the past 15 years.  Have at least a 30-pack-year history of smoking. A pack year is smoking an average of one pack of cigarettes a day for 1 year.  Yearly screening should continue until it has been 15 years since you quit.  Yearly screening should stop if you develop a health problem that would prevent you from having lung cancer treatment.  Breast Cancer  Practice breast self-awareness. This means understanding how your breasts normally appear and  feel.  It also means doing regular breast self-exams. Let your health care provider know about any changes, no matter how small.  If you are in your 20s or 30s, you should have a clinical breast exam (CBE) by a health care provider every 1-3 years as part of a regular health exam.  If you are 40 or older, have a CBE every year. Also consider having a breast X-ray (mammogram) every year.  If you have a family history of breast cancer, talk to your health care provider about genetic  screening.  If you are at high risk for breast cancer, talk to your health care provider about having an MRI and a mammogram every year.  Breast cancer gene (BRCA) assessment is recommended for women who have family members with BRCA-related cancers. BRCA-related cancers include:  Breast.  Ovarian.  Tubal.  Peritoneal cancers.  Results of the assessment will determine the need for genetic counseling and BRCA1 and BRCA2 testing. Cervical Cancer Routine pelvic examinations to screen for cervical cancer are no longer recommended for nonpregnant women who are considered low risk for cancer of the pelvic organs (ovaries, uterus, and vagina) and who do not have symptoms. A pelvic examination may be necessary if you have symptoms including those associated with pelvic infections. Ask your health care provider if a screening pelvic exam is right for you.   The Pap test is the screening test for cervical cancer for women who are considered at risk.  If you had a hysterectomy for a problem that was not cancer or a condition that could lead to cancer, then you no longer need Pap tests.  If you are older than 65 years, and you have had normal Pap tests for the past 10 years, you no longer need to have Pap tests.  If you have had past treatment for cervical cancer or a condition that could lead to cancer, you need Pap tests and screening for cancer for at least 20 years after your treatment.  If you no longer get a Pap test, assess your risk factors if they change (such as having a new sexual partner). This can affect whether you should start being screened again.  Some women have medical problems that increase their chance of getting cervical cancer. If this is the case for you, your health care provider may recommend more frequent screening and Pap tests.  The human papillomavirus (HPV) test is another test that may be used for cervical cancer screening. The HPV test looks for the virus that can  cause cell changes in the cervix. The cells collected during the Pap test can be tested for HPV.  The HPV test can be used to screen women 30 years of age and older. Getting tested for HPV can extend the interval between normal Pap tests from three to five years.  An HPV test also should be used to screen women of any age who have unclear Pap test results.  After 47 years of age, women should have HPV testing as often as Pap tests.  Colorectal Cancer  This type of cancer can be detected and often prevented.  Routine colorectal cancer screening usually begins at 47 years of age and continues through 47 years of age.  Your health care provider may recommend screening at an earlier age if you have risk factors for colon cancer.  Your health care provider may also recommend using home test kits to check for hidden blood in the stool.  A small camera   at the end of a tube can be used to examine your colon directly (sigmoidoscopy or colonoscopy). This is done to check for the earliest forms of colorectal cancer.  Routine screening usually begins at age 50.  Direct examination of the colon should be repeated every 5-10 years through 47 years of age. However, you may need to be screened more often if early forms of precancerous polyps or small growths are found. Skin Cancer  Check your skin from head to toe regularly.  Tell your health care provider about any new moles or changes in moles, especially if there is a change in a mole's shape or color.  Also tell your health care provider if you have a mole that is larger than the size of a pencil eraser.  Always use sunscreen. Apply sunscreen liberally and repeatedly throughout the day.  Protect yourself by wearing long sleeves, pants, a wide-brimmed hat, and sunglasses whenever you are outside. HEART DISEASE, DIABETES, AND HIGH BLOOD PRESSURE   Have your blood pressure checked at least every 1-2 years. High blood pressure causes heart  disease and increases the risk of stroke.  If you are between 55 years and 79 years old, ask your health care provider if you should take aspirin to prevent strokes.  Have regular diabetes screenings. This involves taking a blood sample to check your fasting blood sugar level.  If you are at a normal weight and have a low risk for diabetes, have this test once every three years after 47 years of age.  If you are overweight and have a high risk for diabetes, consider being tested at a younger age or more often. PREVENTING INFECTION  Hepatitis B  If you have a higher risk for hepatitis B, you should be screened for this virus. You are considered at high risk for hepatitis B if:  You were born in a country where hepatitis B is common. Ask your health care provider which countries are considered high risk.  Your parents were born in a high-risk country, and you have not been immunized against hepatitis B (hepatitis B vaccine).  You have HIV or AIDS.  You use needles to inject street drugs.  You live with someone who has hepatitis B.  You have had sex with someone who has hepatitis B.  You get hemodialysis treatment.  You take certain medicines for conditions, including cancer, organ transplantation, and autoimmune conditions. Hepatitis C  Blood testing is recommended for:  Everyone born from 1945 through 1965.  Anyone with known risk factors for hepatitis C. Sexually transmitted infections (STIs)  You should be screened for sexually transmitted infections (STIs) including gonorrhea and chlamydia if:  You are sexually active and are younger than 47 years of age.  You are older than 47 years of age and your health care provider tells you that you are at risk for this type of infection.  Your sexual activity has changed since you were last screened and you are at an increased risk for chlamydia or gonorrhea. Ask your health care provider if you are at risk.  If you do not have  HIV, but are at risk, it may be recommended that you take a prescription medicine daily to prevent HIV infection. This is called pre-exposure prophylaxis (PrEP). You are considered at risk if:  You are sexually active and do not regularly use condoms or know the HIV status of your partner(s).  You take drugs by injection.  You are sexually active with a partner   who has HIV. Talk with your health care provider about whether you are at high risk of being infected with HIV. If you choose to begin PrEP, you should first be tested for HIV. You should then be tested every 3 months for as long as you are taking PrEP.  PREGNANCY   If you are premenopausal and you may become pregnant, ask your health care provider about preconception counseling.  If you may become pregnant, take 400 to 800 micrograms (mcg) of folic acid every day.  If you want to prevent pregnancy, talk to your health care provider about birth control (contraception). OSTEOPOROSIS AND MENOPAUSE   Osteoporosis is a disease in which the bones lose minerals and strength with aging. This can result in serious bone fractures. Your risk for osteoporosis can be identified using a bone density scan.  If you are 65 years of age or older, or if you are at risk for osteoporosis and fractures, ask your health care provider if you should be screened.  Ask your health care provider whether you should take a calcium or vitamin D supplement to lower your risk for osteoporosis.  Menopause may have certain physical symptoms and risks.  Hormone replacement therapy may reduce some of these symptoms and risks. Talk to your health care provider about whether hormone replacement therapy is right for you.  HOME CARE INSTRUCTIONS   Schedule regular health, dental, and eye exams.  Stay current with your immunizations.   Do not use any tobacco products including cigarettes, chewing tobacco, or electronic cigarettes.  If you are pregnant, do not  drink alcohol.  If you are breastfeeding, limit how much and how often you drink alcohol.  Limit alcohol intake to no more than 1 drink per day for nonpregnant women. One drink equals 12 ounces of beer, 5 ounces of wine, or 1 ounces of hard liquor.  Do not use street drugs.  Do not share needles.  Ask your health care provider for help if you need support or information about quitting drugs.  Tell your health care provider if you often feel depressed.  Tell your health care provider if you have ever been abused or do not feel safe at home. Document Released: 05/05/2011 Document Revised: 03/06/2014 Document Reviewed: 09/21/2013 Exit[employer] Patient Information 2015 [employer]. This information is not intended to replace advice given to you by your health care provider. Make sure you discuss any questions you have with your health care provider.  

## 2014-10-25 NOTE — Assessment & Plan Note (Signed)
Wt Readings from Last 3 Encounters:  10/25/14 231 lb (104.781 kg)  11/08/13 224 lb (101.606 kg)  10/12/13 227 lb (102.967 kg)   The patient is asked to make an attempt to improve diet and exercise patterns to aid in medical management of this problem.

## 2015-03-05 ENCOUNTER — Ambulatory Visit (INDEPENDENT_AMBULATORY_CARE_PROVIDER_SITE_OTHER)
Admission: RE | Admit: 2015-03-05 | Discharge: 2015-03-05 | Disposition: A | Discharge status: Home / Self Care | Payer: 59 | Source: Ambulatory Visit | Attending: Internal Medicine | Admitting: Internal Medicine

## 2015-03-05 ENCOUNTER — Ambulatory Visit (INDEPENDENT_AMBULATORY_CARE_PROVIDER_SITE_OTHER): Payer: 59 | Admitting: Internal Medicine

## 2015-03-05 ENCOUNTER — Other Ambulatory Visit (INDEPENDENT_AMBULATORY_CARE_PROVIDER_SITE_OTHER): Payer: 59

## 2015-03-05 ENCOUNTER — Encounter: Payer: Self-pay | Admitting: Internal Medicine

## 2015-03-05 ENCOUNTER — Other Ambulatory Visit: Payer: Self-pay

## 2015-03-05 VITALS — BP 130/90 | HR 89 | Temp 98.3°F | Resp 13 | Ht 68.0 in | Wt 235.8 lb

## 2015-03-05 DIAGNOSIS — D170 Benign lipomatous neoplasm of skin and subcutaneous tissue of head, face and neck: Secondary | ICD-10-CM

## 2015-03-05 DIAGNOSIS — Z8489 Family history of other specified conditions: Secondary | ICD-10-CM

## 2015-03-05 DIAGNOSIS — Z1231 Encounter for screening mammogram for malignant neoplasm of breast: Secondary | ICD-10-CM

## 2015-03-05 DIAGNOSIS — R0600 Dyspnea, unspecified: Secondary | ICD-10-CM

## 2015-03-05 DIAGNOSIS — Z87898 Personal history of other specified conditions: Secondary | ICD-10-CM

## 2015-03-05 DIAGNOSIS — M5412 Radiculopathy, cervical region: Secondary | ICD-10-CM | POA: Diagnosis not present

## 2015-03-05 DIAGNOSIS — R0609 Other forms of dyspnea: Secondary | ICD-10-CM

## 2015-03-05 DIAGNOSIS — R635 Abnormal weight gain: Secondary | ICD-10-CM | POA: Diagnosis not present

## 2015-03-05 LAB — TSH: TSH: 2.25 u[IU]/mL (ref 0.35–4.50)

## 2015-03-05 LAB — CBC WITH DIFFERENTIAL/PLATELET
BASOS ABS: 0 10*3/uL (ref 0.0–0.1)
Basophils Relative: 0.6 % (ref 0.0–3.0)
EOS ABS: 0.1 10*3/uL (ref 0.0–0.7)
EOS PCT: 1.6 % (ref 0.0–5.0)
HEMATOCRIT: 42.8 % (ref 36.0–46.0)
Hemoglobin: 14.2 g/dL (ref 12.0–15.0)
LYMPHS PCT: 30.7 % (ref 12.0–46.0)
Lymphs Abs: 2.1 10*3/uL (ref 0.7–4.0)
MCHC: 33.3 g/dL (ref 30.0–36.0)
MCV: 78.4 fl (ref 78.0–100.0)
MONO ABS: 0.5 10*3/uL (ref 0.1–1.0)
Monocytes Relative: 7.3 % (ref 3.0–12.0)
NEUTROS PCT: 59.8 % (ref 43.0–77.0)
Neutro Abs: 4.1 10*3/uL (ref 1.4–7.7)
Platelets: 258 10*3/uL (ref 150.0–400.0)
RBC: 5.45 Mil/uL — AB (ref 3.87–5.11)
RDW: 13.5 % (ref 11.5–15.5)
WBC: 6.9 10*3/uL (ref 4.0–10.5)

## 2015-03-05 LAB — T4, FREE: FREE T4: 0.67 ng/dL (ref 0.60–1.60)

## 2015-03-05 LAB — T3, FREE: T3 FREE: 3.2 pg/mL (ref 2.3–4.2)

## 2015-03-05 NOTE — Progress Notes (Signed)
   Subjective:    Patient ID: Debra Silva, female    DOB: [birth year], 49 y.o.   MRN: [id number]  HPI  She describes swelling at the base of the neck since January; this is more prominent on the left than the right. This was in the context of pain at the base of the neck on the left which was associated with burning and tingling down the left upper extremity as far as the distal third of the forearm. Symptoms would last up to minutes at a time. There was no trigger or injury prior to the onset of the symptoms. She saw a chiropractor; these symptoms essentially resolved after 2 months. While having the symptoms she was unable to sleep in either the right or left lateral decubitus position. She was only able to sleep supine to prevent active symptoms. She has no other constitutional or neuromuscular symptoms.  Her main concern at this time is the fullness in the supraclavicular area bilaterally.  Review of Systems Fever, chills, sweats, or unexplained weight loss not present. She thought she had gained 10 pounds in this period time ;but records reveal11# 12 oz gain since 1/15 and  4#12 oz since 10/25/14. No significant headaches. Mental status change or memory loss denied. Blurred vision , diplopia or vision loss absent. Vertigo, near syncope or imbalance denied. There is no numbness, tingling, or weakness in extremities.   No loss of control of bladder or bowels. Radicular type pain absent. No seizure stigmata. Denied are chest pain, palpitations, claudication, edema, paroxysmal nocturnal dyspnea,  significant cough, or hemoptysis. She does have exertional dyspnea climbing stairs. She was told she snores loudly.    Objective:   Physical Exam  Pertinent or positive findings include: There is lipomatous change in the supraclavicular area bilaterally. This tissue does appear to transilluminate General appearance :adequately nourished; in no distress. BMI: 35.85 Neck: full ROM; thyroid  normal Eyes: No conjunctival inflammation or scleral icterus is present. Oral exam:  Lips and gums are healthy appearing.There is no oropharyngeal erythema or exudate noted. Dental hygiene is good. Heart:  Normal rate and regular rhythm. S1 and S2 normal without gallop, murmur, click, rub or other extra sounds   Lungs:Chest clear to auscultation; no wheezes, rhonchi,rales ,or rubs present.No increased work of breathing.  Abdomen: bowel sounds normal, soft and non-tender without masses, organomegaly or hernias noted.  No guarding or rebound.  Vascular : all pulses equal ; no bruits present. Skin:Warm & dry.  Intact without suspicious lesions or rashes ; no tenting  Lymphatic: No lymphadenopathy is noted about the head, neck, axilla Neurologic exam : Cn 2-7 intact Strength equal & normal in upper & lower extremities Able to walk on heels and toes.   Balance normal  Romberg normal, finger to nose negative        Assessment & Plan:  #1 cervical radiculopathy; C5-6 suggested;essentially resolved  #2 lipomatous changes in the supraclavicular area, left greater than right  #3 DOE  #4 snoring  Plan: See orders and recommendations

## 2015-03-05 NOTE — Patient Instructions (Signed)
  Your next office appointment will be determined based upon review of your pending labs  and  xrays  Those instructions will be transmitted to you by My Chart  Critical results will be called.   Followup as needed for any active or acute issue. Please report any significant change in your symptoms. 

## 2015-03-05 NOTE — Progress Notes (Signed)
Pre visit review using our clinic review tool, if applicable. No additional management support is needed unless otherwise documented below in the visit note. 

## 2015-04-16 ENCOUNTER — Ambulatory Visit
Admission: RE | Admit: 2015-04-16 | Discharge: 2015-04-16 | Disposition: A | Discharge status: Home / Self Care | Payer: 59 | Source: Ambulatory Visit

## 2015-04-16 DIAGNOSIS — Z1231 Encounter for screening mammogram for malignant neoplasm of breast: Secondary | ICD-10-CM

## 2015-10-31 ENCOUNTER — Encounter: Payer: Self-pay | Admitting: Internal Medicine

## 2015-10-31 ENCOUNTER — Other Ambulatory Visit (INDEPENDENT_AMBULATORY_CARE_PROVIDER_SITE_OTHER): Payer: 59

## 2015-10-31 ENCOUNTER — Ambulatory Visit (INDEPENDENT_AMBULATORY_CARE_PROVIDER_SITE_OTHER): Payer: 59 | Admitting: Internal Medicine

## 2015-10-31 VITALS — BP 110/80 | HR 88 | Temp 98.3°F | Ht 68.0 in | Wt 239.5 lb

## 2015-10-31 DIAGNOSIS — Z114 Encounter for screening for human immunodeficiency virus [HIV]: Secondary | ICD-10-CM

## 2015-10-31 DIAGNOSIS — R0683 Snoring: Secondary | ICD-10-CM

## 2015-10-31 DIAGNOSIS — Z23 Encounter for immunization: Secondary | ICD-10-CM

## 2015-10-31 DIAGNOSIS — Z0001 Encounter for general adult medical examination with abnormal findings: Secondary | ICD-10-CM

## 2015-10-31 DIAGNOSIS — Z0000 Encounter for general adult medical examination without abnormal findings: Secondary | ICD-10-CM | POA: Diagnosis not present

## 2015-10-31 DIAGNOSIS — E669 Obesity, unspecified: Secondary | ICD-10-CM | POA: Diagnosis not present

## 2015-10-31 LAB — CBC WITH DIFFERENTIAL/PLATELET
BASOS PCT: 0.6 % (ref 0.0–3.0)
Basophils Absolute: 0 10*3/uL (ref 0.0–0.1)
EOS ABS: 0.1 10*3/uL (ref 0.0–0.7)
Eosinophils Relative: 0.9 % (ref 0.0–5.0)
HEMATOCRIT: 46.9 % — AB (ref 36.0–46.0)
Hemoglobin: 15.4 g/dL — ABNORMAL HIGH (ref 12.0–15.0)
LYMPHS ABS: 2.7 10*3/uL (ref 0.7–4.0)
LYMPHS PCT: 32.6 % (ref 12.0–46.0)
MCHC: 32.8 g/dL (ref 30.0–36.0)
MCV: 79.5 fl (ref 78.0–100.0)
Monocytes Absolute: 0.5 10*3/uL (ref 0.1–1.0)
Monocytes Relative: 5.8 % (ref 3.0–12.0)
NEUTROS ABS: 5 10*3/uL (ref 1.4–7.7)
Neutrophils Relative %: 60.1 % (ref 43.0–77.0)
PLATELETS: 277 10*3/uL (ref 150.0–400.0)
RBC: 5.9 Mil/uL — ABNORMAL HIGH (ref 3.87–5.11)
RDW: 14 % (ref 11.5–15.5)
WBC: 8.4 10*3/uL (ref 4.0–10.5)

## 2015-10-31 LAB — URINALYSIS, ROUTINE W REFLEX MICROSCOPIC
Bilirubin Urine: NEGATIVE
HGB URINE DIPSTICK: NEGATIVE
Ketones, ur: NEGATIVE
LEUKOCYTES UA: NEGATIVE
Nitrite: NEGATIVE
RBC / HPF: NONE SEEN (ref 0–?)
SPECIFIC GRAVITY, URINE: 1.015 (ref 1.000–1.030)
Total Protein, Urine: NEGATIVE
URINE GLUCOSE: NEGATIVE
Urobilinogen, UA: 0.2 (ref 0.0–1.0)
pH: 6.5 (ref 5.0–8.0)

## 2015-10-31 LAB — BASIC METABOLIC PANEL
BUN: 6 mg/dL (ref 6–23)
CHLORIDE: 103 meq/L (ref 96–112)
CO2: 27 mEq/L (ref 19–32)
Calcium: 10.5 mg/dL (ref 8.4–10.5)
Creatinine, Ser: 1.08 mg/dL (ref 0.40–1.20)
GFR: 69.58 mL/min (ref >60.00)
Glucose, Bld: 96 mg/dL (ref 70–99)
POTASSIUM: 4.7 meq/L (ref 3.5–5.1)
SODIUM: 140 meq/L (ref 135–145)

## 2015-10-31 LAB — VITAMIN D 25 HYDROXY (VIT D DEFICIENCY, FRACTURES): VITD: 47.49 ng/mL (ref 30.00–100.00)

## 2015-10-31 LAB — HIV ANTIBODY (ROUTINE TESTING W REFLEX): HIV: NONREACTIVE

## 2015-10-31 LAB — HEPATIC FUNCTION PANEL
ALBUMIN: 4.3 g/dL (ref 3.5–5.2)
ALT: 16 U/L (ref 0–35)
AST: 18 U/L (ref 0–37)
Alkaline Phosphatase: 75 U/L (ref 39–117)
BILIRUBIN DIRECT: 0 mg/dL (ref 0.0–0.3)
TOTAL PROTEIN: 7.8 g/dL (ref 6.0–8.3)
Total Bilirubin: 0.3 mg/dL (ref 0.2–1.2)

## 2015-10-31 LAB — LIPID PANEL
CHOL/HDL RATIO: 2
Cholesterol: 155 mg/dL (ref 0–200)
HDL: 62.1 mg/dL (ref >39.00)
LDL Cholesterol: 63 mg/dL (ref 0–99)
NONHDL: 92.54
Triglycerides: 149 mg/dL (ref 0.0–149.0)
VLDL: 29.8 mg/dL (ref 0.0–40.0)

## 2015-10-31 LAB — TSH: TSH: 2.25 u[IU]/mL (ref 0.35–4.50)

## 2015-10-31 NOTE — Assessment & Plan Note (Signed)
Wt Readings from Last 3 Encounters:  10/31/15 239 lb 8 oz (108.636 kg)  03/05/15 235 lb 12 oz (106.935 kg)  10/25/14 231 lb (104.781 kg)   Continued upward trend reviewed Patient is actively pursuing appropriate lifestyle intervention such as walking more than 10,000 steps a day, choosing white meats and fish with appropriate baking technique rather than fried. Patient understands and controls portion sizes  Patient has membership to [employer] and will initiate new aerobic program after start 2017  We'll also make referral to medical nutritionist to review other strategies for weight control  Discussed potential of phentermine or other prescription options which do not seem appropriate at this time, nor does patient wish to use prescription medications  the patient is asked to make an attempt to improve diet and exercise patterns to aid in medical management of this problem.

## 2015-10-31 NOTE — Progress Notes (Signed)
Subjective:    Patient ID: Debra Silva, female    DOB: October 24, 1966, 49 y.o.   MRN: [id number]  HPI  patient is here today for annual physical. Patient feels well and has no complaints. Also reviewed chronic medical conditions, interval events and current concerns   Past Medical History  Diagnosis Date  . Migraine     hx of, resolved   Family History  Problem Relation Age of Onset  . Breast cancer Mother 47  . Diabetes Maternal Grandmother     on insulin  . Kidney disease Father   . ALS Maternal Grandmother 83  . Stroke Father   . Hypertension Brother    Social History  Substance Use Topics  . Smoking status: Never Smoker   . Smokeless tobacco: Never Used  . Alcohol Use: No    Review of Systems  Constitutional: Positive for fatigue and unexpected weight change (continued gain).  Respiratory: Negative for cough, chest tightness, shortness of breath and wheezing. Apnea: ??apnea unsure - snoring noted by family.   Cardiovascular: Negative for chest pain, palpitations and leg swelling.  Gastrointestinal: Negative for nausea, abdominal pain and diarrhea.  Neurological: Negative for dizziness, weakness, light-headedness and headaches.  Psychiatric/Behavioral: Negative for dysphoric mood. The patient is not nervous/anxious.   All other systems reviewed and are negative.      Objective:    Physical Exam  Constitutional: She is oriented to person, place, and time. She appears well-developed and well-nourished. No distress.  obese  HENT:  Head: Normocephalic and atraumatic.  Right Ear: External ear normal.  Left Ear: External ear normal.  Nose: Nose normal.  Mouth/Throat: Oropharynx is clear and moist. No oropharyngeal exudate.  Eyes: EOM are normal. Pupils are equal, round, and reactive to light. Right eye exhibits no discharge. Left eye exhibits no discharge. No scleral icterus.  Neck: Normal range of motion. Neck supple. No JVD present. No tracheal deviation present.  No thyromegaly present.  Cardiovascular: Normal rate, regular rhythm, normal heart sounds and intact distal pulses.  Exam reveals no friction rub.   No murmur heard. Pulmonary/Chest: Effort normal and breath sounds normal. No respiratory distress. She has no wheezes. She has no rales. She exhibits no tenderness.  Abdominal: Soft. Bowel sounds are normal. She exhibits no distension and no mass. There is no tenderness. There is no rebound and no guarding.  Genitourinary:  Defer to gyn  Musculoskeletal: Normal range of motion.  No gross deformities  Lymphadenopathy:    She has no cervical adenopathy.  Neurological: She is alert and oriented to person, place, and time. She has normal reflexes. No cranial nerve deficit.  Skin: Skin is warm and dry. No rash noted. She is not diaphoretic. No erythema.  Psychiatric: She has a normal mood and affect. Her behavior is normal. Judgment and thought content normal.  Nursing note and vitals reviewed.   BP 110/80 mmHg  Pulse 88  Temp(Src) 98.3 F (36.8 C) (Oral)  Ht 5\' 8"  (1.727 m)  Wt 239 lb 8 oz (108.636 kg)  BMI 36.42 kg/m2  SpO2 97% Wt Readings from Last 3 Encounters:  10/31/15 239 lb 8 oz (108.636 kg)  03/05/15 235 lb 12 oz (106.935 kg)  10/25/14 231 lb (104.781 kg)     Lab Results  Component Value Date   WBC 6.9 03/05/2015   HGB 14.2 03/05/2015   HCT 42.8 03/05/2015   PLT 258.0 03/05/2015   GLUCOSE 90 10/25/2014   CHOL 188 10/25/2014   TRIG  87.0 10/25/2014   HDL 58.30 10/25/2014   LDLCALC 112* 10/25/2014   ALT 15 10/25/2014   AST 16 10/25/2014   NA 138 10/25/2014   K 4.9 10/25/2014   CL 103 10/25/2014   CREATININE 1.0 10/25/2014   BUN 7 10/25/2014   CO2 26 10/25/2014   TSH 2.25 03/05/2015    Mm Screening Breast Tomo Bilateral  04/17/2015  CLINICAL DATA:  Screening. EXAM: DIGITAL SCREENING BILATERAL MAMMOGRAM WITH 3D TOMO WITH CAD COMPARISON:  Previous exam(s). ACR Breast Density Category c: The breast tissue is  heterogeneously dense, which may obscure small masses. FINDINGS: There are no findings suspicious for malignancy. Images were processed with CAD. IMPRESSION: No mammographic evidence of malignancy. A result letter of this screening mammogram will be mailed directly to the patient. RECOMMENDATION: Screening mammogram in one year. (Code:SM-B-01Y) BI-RADS CATEGORY  1: Negative. Electronically Signed   By: Claudie Revering M.D.   On: 04/17/2015 09:59       Assessment & Plan:   CPX/z00.00 - Patient has been counseled on age-appropriate routine health concerns for screening and prevention. These are reviewed and up-to-date. Immunizations are up-to-date or declined. Labs ordered and reviewed.  Snoring. With weight gain trend and daytime fatigue, history suggestive of sleep apnea. Refer to sleep medicine for evaluation and treatment as needed based on findings  Problem List Items Addressed This Visit    Obesity (BMI 30-39.9)    Wt Readings from Last 3 Encounters:  10/31/15 239 lb 8 oz (108.636 kg)  03/05/15 235 lb 12 oz (106.935 kg)  10/25/14 231 lb (104.781 kg)   Continued upward trend reviewed Patient is actively pursuing appropriate lifestyle intervention such as walking more than 10,000 steps a day, choosing white meats and fish with appropriate baking technique rather than fried. Patient understands and controls portion sizes  Patient has membership to [employer] and will initiate new aerobic program after start 2017  We'll also make referral to medical nutritionist to review other strategies for weight control  Discussed potential of phentermine or other prescription options which do not seem appropriate at this time, nor does patient wish to use prescription medications  the patient is asked to make an attempt to improve diet and exercise patterns to aid in medical management of this problem.       Relevant Orders   Amb ref to Medical Nutrition Therapy-MNT   Ambulatory referral to Pulmonology      Other Visit Diagnoses    Routine general medical examination at a health care facility    -  Primary    Relevant Orders    Basic metabolic panel    CBC with Differential/Platelet    Hepatic function panel    Lipid panel    TSH    Urinalysis, Routine w reflex microscopic (not at [facility])    VITAMIN D 25 Hydroxy (Vit-D Deficiency, Fractures)    Snoring        Relevant Orders    Ambulatory referral to Pulmonology        Gwendolyn Grant, MD

## 2015-10-31 NOTE — Patient Instructions (Addendum)
It was good to see you today.  We have reviewed your prior records including labs and tests today  Annual flu shot updated today - other Health Maintenance reviewed - all recommended immunizations and age-appropriate screenings are up-to-date.  Test(s) ordered today. Your results will be released to Debra Silva (or called to you) after review, usually within 72hours after test completion. If any changes need to be made, you will be notified at that same time.  Medications reviewed and updated, no changes recommended at this time.  we'll make referral to medical nutritionist for assistance with weight loss coaching and to sleep specialist for evaluation of snoring and possible sleep apnea. Our office will contact you regarding appointment(s) once made.  Please schedule followup in 12 months for annual exam and labs with new PCP (Dr. Quay Burow), call sooner if problems.  Health Maintenance, Female Adopting a healthy lifestyle and getting preventive care can go a long way to promote health and wellness. Talk with your health care provider about what schedule of regular examinations is right for you. This is a good chance for you to check in with your provider about disease prevention and staying healthy. In between checkups, there are plenty of things you can do on your own. Experts have done a lot of research about which lifestyle changes and preventive measures are most likely to keep you healthy. Ask your health care provider for more information. WEIGHT AND DIET  Eat a healthy diet  Be sure to include plenty of vegetables, fruits, low-fat dairy products, and lean protein.  Do not eat a lot of foods high in solid fats, added sugars, or salt.  Get regular exercise. This is one of the most important things you can do for your health.  Most adults should exercise for at least 150 minutes each week. The exercise should increase your heart rate and make you sweat (moderate-intensity exercise).  Most  adults should also do strengthening exercises at least twice a week. This is in addition to the moderate-intensity exercise.  Maintain a healthy weight  Body mass index (BMI) is a measurement that can be used to identify possible weight problems. It estimates body fat based on height and weight. Your health care provider can help determine your BMI and help you achieve or maintain a healthy weight.  For females 24 years of age and older:   A BMI below 18.5 is considered underweight.  A BMI of 18.5 to 24.9 is normal.  A BMI of 25 to 29.9 is considered overweight.  A BMI of 30 and above is considered obese.  Watch levels of cholesterol and blood lipids  You should start having your blood tested for lipids and cholesterol at 48 years of age, then have this test every 5 years.  You may need to have your cholesterol levels checked more often if:  Your lipid or cholesterol levels are high.  You are older than 48 years of age.  You are at high risk for heart disease.  CANCER SCREENING   Lung Cancer  Lung cancer screening is recommended for adults 28-60 years old who are at high risk for lung cancer because of a history of smoking.  A yearly low-dose CT scan of the lungs is recommended for people who:  Currently smoke.  Have quit within the past 15 years.  Have at least a 30-pack-year history of smoking. A pack year is smoking an average of one pack of cigarettes a day for 1 year.  Yearly screening  should continue until it has been 15 years since you quit.  Yearly screening should stop if you develop a health problem that would prevent you from having lung cancer treatment.  Breast Cancer  Practice breast self-awareness. This means understanding how your breasts normally appear and feel.  It also means doing regular breast self-exams. Let your health care provider know about any changes, no matter how small.  If you are in your 20s or 30s, you should have a clinical  breast exam (CBE) by a health care provider every 1-3 years as part of a regular health exam.  If you are 47 or older, have a CBE every year. Also consider having a breast X-ray (mammogram) every year.  If you have a family history of breast cancer, talk to your health care provider about genetic screening.  If you are at high risk for breast cancer, talk to your health care provider about having an MRI and a mammogram every year.  Breast cancer gene (BRCA) assessment is recommended for women who have family members with BRCA-related cancers. BRCA-related cancers include:  Breast.  Ovarian.  Tubal.  Peritoneal cancers.  Results of the assessment will determine the need for genetic counseling and BRCA1 and BRCA2 testing. Cervical Cancer Your health care provider may recommend that you be screened regularly for cancer of the pelvic organs (ovaries, uterus, and vagina). This screening involves a pelvic examination, including checking for microscopic changes to the surface of your cervix (Pap test). You may be encouraged to have this screening done every 3 years, beginning at age 70.  For women ages 4-65, health care providers may recommend pelvic exams and Pap testing every 3 years, or they may recommend the Pap and pelvic exam, combined with testing for human papilloma virus (HPV), every 5 years. Some types of HPV increase your risk of cervical cancer. Testing for HPV may also be done on women of any age with unclear Pap test results.  Other health care providers may not recommend any screening for nonpregnant women who are considered low risk for pelvic cancer and who do not have symptoms. Ask your health care provider if a screening pelvic exam is right for you.  If you have had past treatment for cervical cancer or a condition that could lead to cancer, you need Pap tests and screening for cancer for at least 20 years after your treatment. If Pap tests have been discontinued, your risk  factors (such as having a new sexual partner) need to be reassessed to determine if screening should resume. Some women have medical problems that increase the chance of getting cervical cancer. In these cases, your health care provider may recommend more frequent screening and Pap tests. Colorectal Cancer  This type of cancer can be detected and often prevented.  Routine colorectal cancer screening usually begins at 48 years of age and continues through 48 years of age.  Your health care provider may recommend screening at an earlier age if you have risk factors for colon cancer.  Your health care provider may also recommend using home test kits to check for hidden blood in the stool.  A small camera at the end of a tube can be used to examine your colon directly (sigmoidoscopy or colonoscopy). This is done to check for the earliest forms of colorectal cancer.  Routine screening usually begins at age 64.  Direct examination of the colon should be repeated every 5-10 years through 48 years of age. However, you may need  to be screened more often if early forms of precancerous polyps or small growths are found. Skin Cancer  Check your skin from head to toe regularly.  Tell your health care provider about any new moles or changes in moles, especially if there is a change in a mole's shape or color.  Also tell your health care provider if you have a mole that is larger than the size of a pencil eraser.  Always use sunscreen. Apply sunscreen liberally and repeatedly throughout the day.  Protect yourself by wearing long sleeves, pants, a wide-brimmed hat, and sunglasses whenever you are outside. HEART DISEASE, DIABETES, AND HIGH BLOOD PRESSURE   High blood pressure causes heart disease and increases the risk of stroke. High blood pressure is more likely to develop in:  People who have blood pressure in the high end of the normal range (130-139/85-89 mm Hg).  People who are overweight or  obese.  People who are African American.  If you are 68-38 years of age, have your blood pressure checked every 3-5 years. If you are 22 years of age or older, have your blood pressure checked every year. You should have your blood pressure measured twice--once when you are at a hospital or clinic, and once when you are not at a hospital or clinic. Record the average of the two measurements. To check your blood pressure when you are not at a hospital or clinic, you can use:  An automated blood pressure machine at a pharmacy.  A home blood pressure monitor.  If you are between 63 years and 72 years old, ask your health care provider if you should take aspirin to prevent strokes.  Have regular diabetes screenings. This involves taking a blood sample to check your fasting blood sugar level.  If you are at a normal weight and have a low risk for diabetes, have this test once every three years after 48 years of age.  If you are overweight and have a high risk for diabetes, consider being tested at a younger age or more often. PREVENTING INFECTION  Hepatitis B  If you have a higher risk for hepatitis B, you should be screened for this virus. You are considered at high risk for hepatitis B if:  You were born in a country where hepatitis B is common. Ask your health care provider which countries are considered high risk.  Your parents were born in a high-risk country, and you have not been immunized against hepatitis B (hepatitis B vaccine).  You have HIV or AIDS.  You use needles to inject street drugs.  You live with someone who has hepatitis B.  You have had sex with someone who has hepatitis B.  You get hemodialysis treatment.  You take certain medicines for conditions, including cancer, organ transplantation, and autoimmune conditions. Hepatitis C  Blood testing is recommended for:  Everyone born from [birth year] through 1964.  Anyone with known risk factors for hepatitis  C. Sexually transmitted infections (STIs)  You should be screened for sexually transmitted infections (STIs) including gonorrhea and chlamydia if:  You are sexually active and are younger than 48 years of age.  You are older than 48 years of age and your health care provider tells you that you are at risk for this type of infection.  Your sexual activity has changed since you were last screened and you are at an increased risk for chlamydia or gonorrhea. Ask your health care provider if you are at risk.  If  you do not have HIV, but are at risk, it may be recommended that you take a prescription medicine daily to prevent HIV infection. This is called pre-exposure prophylaxis (PrEP). You are considered at risk if:  You are sexually active and do not regularly use condoms or know the HIV status of your partner(s).  You take drugs by injection.  You are sexually active with a partner who has HIV. Talk with your health care provider about whether you are at high risk of being infected with HIV. If you choose to begin PrEP, you should first be tested for HIV. You should then be tested every 3 months for as long as you are taking PrEP.  PREGNANCY   If you are premenopausal and you may become pregnant, ask your health care provider about preconception counseling.  If you may become pregnant, take 400 to 800 micrograms (mcg) of folic acid every day.  If you want to prevent pregnancy, talk to your health care provider about birth control (contraception). OSTEOPOROSIS AND MENOPAUSE   Osteoporosis is a disease in which the bones lose minerals and strength with aging. This can result in serious bone fractures. Your risk for osteoporosis can be identified using a bone density scan.  If you are 66 years of age or older, or if you are at risk for osteoporosis and fractures, ask your health care provider if you should be screened.  Ask your health care provider whether you should take a calcium or  vitamin D supplement to lower your risk for osteoporosis.  Menopause may have certain physical symptoms and risks.  Hormone replacement therapy may reduce some of these symptoms and risks. Talk to your health care provider about whether hormone replacement therapy is right for you.  HOME CARE INSTRUCTIONS   Schedule regular health, dental, and eye exams.  Stay current with your immunizations.   Do not use any tobacco products including cigarettes, chewing tobacco, or electronic cigarettes.  If you are pregnant, do not drink alcohol.  If you are breastfeeding, limit how much and how often you drink alcohol.  Limit alcohol intake to no more than 1 drink per day for nonpregnant women. One drink equals 12 ounces of beer, 5 ounces of wine, or 1 ounces of hard liquor.  Do not use street drugs.  Do not share needles.  Ask your health care provider for help if you need support or information about quitting drugs.  Tell your health care provider if you often feel depressed.  Tell your health care provider if you have ever been abused or do not feel safe at home.   This information is not intended to replace advice given to you by your health care provider. Make sure you discuss any questions you have with your health care provider.   Document Released: 05/05/2011 Document Revised: 11/10/2014 Document Reviewed: 09/21/2013 Elsevier Interactive Patient Education 2016 [employer]. Exercising to [employer] Exercising can help you to lose weight. In order to lose weight through exercise, you need to do vigorous-intensity exercise. You can tell that you are exercising with vigorous intensity if you are breathing very hard and fast and cannot hold a conversation while exercising. Moderate-intensity exercise helps to maintain your current weight. You can tell that you are exercising at a moderate level if you have a higher heart rate and faster breathing, but you are still able to hold a  conversation. HOW OFTEN SHOULD I EXERCISE? Choose an activity that you enjoy and set realistic goals.  Your health care provider can help you to make an activity plan that works for you. Exercise regularly as directed by your health care provider. This may include:  Doing resistance training twice each week, such as:  Push-ups.  Sit-ups.  Lifting weights.  Using resistance bands.  Doing a given intensity of exercise for a given amount of time. Choose from these options:  150 minutes of moderate-intensity exercise every week.  75 minutes of vigorous-intensity exercise every week.  A mix of moderate-intensity and vigorous-intensity exercise every week. Children, pregnant women, people who are out of shape, people who are overweight, and older adults may need to consult a health care provider for individual recommendations. If you have any sort of medical condition, be sure to consult your health care provider before starting a new exercise program. WHAT ARE SOME ACTIVITIES THAT CAN HELP ME TO LOSE WEIGHT?   Walking at a rate of at least 4.5 miles an hour.  Jogging or running at a rate of 5 miles per hour.  Biking at a rate of at least 10 miles per hour.  Lap swimming.  Roller-skating or in-line skating.  Cross-country skiing.  Vigorous competitive sports, such as football, basketball, and soccer.  Jumping rope.  Aerobic dancing. HOW CAN I BE MORE ACTIVE IN MY DAY-TO-DAY ACTIVITIES?  Use the stairs instead of the elevator.  Take a walk during your lunch break.  If you drive, park your car farther away from work or school.  If you take public transportation, get off one stop early and walk the rest of the way.  Make all of your phone calls while standing up and walking around.  Get up, stretch, and walk around every 30 minutes throughout the day. WHAT GUIDELINES SHOULD I FOLLOW WHILE EXERCISING?  Do not exercise so much that you hurt yourself, feel dizzy, or get  very short of breath.  Consult your health care provider prior to starting a new exercise program.  Wear comfortable clothes and shoes with good support.  Drink plenty of water while you exercise to prevent dehydration or heat stroke. Body water is lost during exercise and must be replaced.  Work out until you breathe faster and your heart beats faster.   This information is not intended to replace advice given to you by your health care provider. Make sure you discuss any questions you have with your health care provider.   Document Released: 11/22/2010 Document Revised: 11/10/2014 Document Reviewed: 03/23/2014 Elsevier Interactive Patient Education [employer].

## 2015-10-31 NOTE — Progress Notes (Signed)
Pre visit review using our clinic review tool, if applicable. No additional management support is needed unless otherwise documented below in the visit note. 

## 2015-11-29 LAB — HM PAP SMEAR: HM PAP: NEGATIVE

## 2016-02-11 ENCOUNTER — Institutional Professional Consult (permissible substitution): Payer: 59 | Admitting: Pulmonary Disease

## 2016-03-07 ENCOUNTER — Other Ambulatory Visit: Payer: Self-pay

## 2016-03-07 DIAGNOSIS — Z1231 Encounter for screening mammogram for malignant neoplasm of breast: Secondary | ICD-10-CM

## 2016-04-16 ENCOUNTER — Other Ambulatory Visit: Payer: Self-pay | Admitting: Gynecology

## 2016-04-16 ENCOUNTER — Ambulatory Visit
Admission: RE | Admit: 2016-04-16 | Discharge: 2016-04-16 | Disposition: A | Discharge status: Home / Self Care | Payer: No Typology Code available for payment source | Source: Ambulatory Visit

## 2016-04-16 DIAGNOSIS — Z1231 Encounter for screening mammogram for malignant neoplasm of breast: Secondary | ICD-10-CM

## 2016-08-26 ENCOUNTER — Ambulatory Visit (INDEPENDENT_AMBULATORY_CARE_PROVIDER_SITE_OTHER): Payer: Self-pay | Admitting: Adult Health

## 2016-08-26 ENCOUNTER — Encounter: Payer: Self-pay | Admitting: Adult Health

## 2016-08-26 VITALS — BP 130/90 | Temp 98.1°F | Ht 68.0 in | Wt 234.1 lb

## 2016-08-26 DIAGNOSIS — M5431 Sciatica, right side: Secondary | ICD-10-CM

## 2016-08-26 MED ORDER — TIZANIDINE HCL 4 MG PO TABS: 4.0000 mg | ORAL_TABLET | Freq: Four times a day (QID) | ORAL | 0 refills | Status: DC | PRN

## 2016-08-26 MED ORDER — KETOROLAC TROMETHAMINE 60 MG/2ML IM SOLN
60.0000 mg | Freq: Once | INTRAMUSCULAR | Status: AC
  Administered 2016-08-26: 60 mg via INTRAMUSCULAR

## 2016-08-26 MED ORDER — METHYLPREDNISOLONE ACETATE 80 MG/ML IJ SUSP
120.0000 mg | Freq: Once | INTRAMUSCULAR | Status: AC
Start: 2016-08-26 — End: 2016-08-26
  Administered 2016-08-26: 120 mg via INTRAMUSCULAR

## 2016-08-26 NOTE — Patient Instructions (Addendum)
I have sent in a prescription for Zanaflex, this is a muscle relaxer. Take as directed. This will make you sleepy.   You can also take Motrin 600mg  every 8 hours for the next three days.   Once you are feeling better start doing the stretching exercises again  Follow up if no improvement   Sciatica Sciatica is pain, weakness, numbness, or tingling along the path of the sciatic nerve. The nerve starts in the lower back and runs down the back of each leg. The nerve controls the muscles in the lower leg and in the back of the knee, while also providing sensation to the back of the thigh, lower leg, and the sole of your foot. Sciatica is a symptom of another medical condition. For instance, nerve damage or certain conditions, such as a herniated disk or bone spur on the spine, pinch or put pressure on the sciatic nerve. This causes the pain, weakness, or other sensations normally associated with sciatica. Generally, sciatica only affects one side of the body. CAUSES   Herniated or slipped disc.  Degenerative disk disease.  A pain disorder involving the narrow muscle in the buttocks (piriformis syndrome).  Pelvic injury or fracture.  Pregnancy.  Tumor (rare). SYMPTOMS  Symptoms can vary from mild to very severe. The symptoms usually travel from the low back to the buttocks and down the back of the leg. Symptoms can include:  Mild tingling or dull aches in the lower back, leg, or hip.  Numbness in the back of the calf or sole of the foot.  Burning sensations in the lower back, leg, or hip.  Sharp pains in the lower back, leg, or hip.  Leg weakness.  Severe back pain inhibiting movement. These symptoms may get worse with coughing, sneezing, laughing, or prolonged sitting or standing. Also, being overweight may worsen symptoms. DIAGNOSIS  Your caregiver will perform a physical exam to look for common symptoms of sciatica. He or she may ask you to do certain movements or activities  that would trigger sciatic nerve pain. Other tests may be performed to find the cause of the sciatica. These may include:  Blood tests.  X-rays.  Imaging tests, such as an MRI or CT scan. TREATMENT  Treatment is directed at the cause of the sciatic pain. Sometimes, treatment is not necessary and the pain and discomfort goes away on its own. If treatment is needed, your caregiver may suggest:  Over-the-counter medicines to relieve pain.  Prescription medicines, such as anti-inflammatory medicine, muscle relaxants, or narcotics.  Applying heat or ice to the painful area.  Steroid injections to lessen pain, irritation, and inflammation around the nerve.  Reducing activity during periods of pain.  Exercising and stretching to strengthen your abdomen and improve flexibility of your spine. Your caregiver may suggest losing weight if the extra weight makes the back pain worse.  Physical therapy.  Surgery to eliminate what is pressing or pinching the nerve, such as a bone spur or part of a herniated disk. HOME CARE INSTRUCTIONS   Only take over-the-counter or prescription medicines for pain or discomfort as directed by your caregiver.  Apply ice to the affected area for 20 minutes, 3-4 times a day for the first 48-72 hours. Then try heat in the same way.  Exercise, stretch, or perform your usual activities if these do not aggravate your pain.  Attend physical therapy sessions as directed by your caregiver.  Keep all follow-up appointments as directed by your caregiver.  Do not  wear high heels or shoes that do not provide proper support.  Check your mattress to see if it is too soft. A firm mattress may lessen your pain and discomfort. SEEK IMMEDIATE MEDICAL CARE IF:   You lose control of your bowel or bladder (incontinence).  You have increasing weakness in the lower back, pelvis, buttocks, or legs.  You have redness or swelling of your back.  You have a burning sensation when  you urinate.  You have pain that gets worse when you lie down or awakens you at night.  Your pain is worse than you have experienced in the past.  Your pain is lasting longer than 4 weeks.  You are suddenly losing weight without reason. MAKE SURE YOU:  Understand these instructions.  Will watch your condition.  Will get help right away if you are not doing well or get worse.   This information is not intended to replace advice given to you by your health care provider. Make sure you discuss any questions you have with your health care provider.   Document Released: 10/14/2001 Document Revised: 07/11/2015 Document Reviewed: 02/29/2012 Elsevier Interactive Patient Education [employer].

## 2016-08-26 NOTE — Progress Notes (Signed)
Subjective:    Patient ID: Debra Silva, female    DOB: 30-Apr-1966, 50 y.o.   MRN: [id number]  HPI  50 year old female who presents to the office today for constant right lower back pain that radiates to down the outside of her right leg x 4 days. The pain is described as " shooting and aching". This pain has limited her daily activities and causes her to be unable to sleep.  She does not have any loss of bowel or bladder  She has been using ASA, TENS unit, heating pads and stretching exercises. Nothing has helped relieve her pain   Review of Systems  Constitutional: Positive for activity change. Negative for chills and diaphoresis.  Respiratory: Negative.   Cardiovascular: Negative.   Musculoskeletal: Positive for back pain and gait problem. Negative for arthralgias, joint swelling, neck pain and neck stiffness.  Skin: Negative.   Hematological: Negative.   All other systems reviewed and are negative.    Past Medical History:  Diagnosis Date  . Migraine    hx of, resolved    Social History   Social History  . Marital status: Single    Spouse name: N/A  . Number of children: 1  . Years of education: N/A   Occupational History  . [employer]    Social History Main Topics  . Smoking status: Never Smoker  . Smokeless tobacco: Never Used  . Alcohol use No  . Drug use: No  . Sexual activity: Not on file   Other Topics Concern  . Not on file   Social History Narrative  . No narrative on file    Past Surgical History:  Procedure Laterality Date  . PARTIAL HYSTERECTOMY  2003    Family History  Problem Relation Age of Onset  . Breast cancer Mother 76  . Diabetes Maternal Grandmother     on insulin  . Kidney disease Father   . ALS Maternal Grandmother 83  . Stroke Father   . Hypertension Brother     No Known Allergies  Current Outpatient Prescriptions on File Prior to Visit  Medication Sig Dispense Refill  . Cholecalciferol (VITAMIN D) 2000 UNITS CAPS Take  1 capsule by mouth daily.     No current facility-administered medications on file prior to visit.     BP 130/90   Temp 98.1 F (36.7 C) (Oral)   Ht 5\' 8"  (1.727 m)   Wt 234 lb 1.6 oz (106.2 kg)   BMI 35.59 kg/m       Objective:   Physical Exam  Constitutional: She is oriented to person, place, and time. She appears well-developed and well-nourished. She appears distressed (from pain ).  Cardiovascular: Normal rate, regular rhythm, normal heart sounds and intact distal pulses.  Exam reveals no friction rub.   No murmur heard. Pulmonary/Chest: Effort normal and breath sounds normal. No respiratory distress. She has no wheezes. She has no rales. She exhibits no tenderness.  Musculoskeletal: She exhibits tenderness. She exhibits no edema or deformity.  Tenderness to the lateral aspect of her left leg, from hip to ankle.  Decrease in ROM. Straight leg raise and knee to chest cause pain.   Neurological: She is alert and oriented to person, place, and time. She has normal reflexes. She displays normal reflexes. No cranial nerve deficit. She exhibits normal muscle tone. Coordination normal.  Skin: Skin is warm and dry. No rash noted. She is not diaphoretic. No erythema. No pallor.  Psychiatric: She  has a normal mood and affect. Her behavior is normal. Judgment and thought content normal.  Vitals reviewed.     Assessment & Plan:  1. Sciatica of right side - ketorolac (TORADOL) injection 60 mg; Inject 2 mLs (60 mg total) into the muscle once. - methylPREDNISolone acetate (DEPO-MEDROL) injection 120 mg; Inject 1.5 mLs (120 mg total) into the muscle once. - tiZANidine (ZANAFLEX) 4 MG tablet; Take 1 tablet (4 mg total) by mouth every 6 (six) hours as needed for muscle spasms.  Dispense: 30 tablet; Refill: 0 - Motrin 600mg  Q8H x 3 days - Heating pad - Stretching exercises - Follow up as needed - Consider lumbar x ray and/or referral to orthopedics  Dorothyann Peng, NP

## 2016-10-31 ENCOUNTER — Ambulatory Visit: Payer: 59 | Admitting: Internal Medicine

## 2017-07-09 ENCOUNTER — Other Ambulatory Visit: Payer: Self-pay | Admitting: Gynecology

## 2017-07-09 DIAGNOSIS — Z1231 Encounter for screening mammogram for malignant neoplasm of breast: Secondary | ICD-10-CM

## 2017-07-15 ENCOUNTER — Encounter: Payer: Self-pay | Admitting: Physician Assistant

## 2017-07-15 ENCOUNTER — Ambulatory Visit (INDEPENDENT_AMBULATORY_CARE_PROVIDER_SITE_OTHER): Payer: 59 | Admitting: Physician Assistant

## 2017-07-15 VITALS — BP 132/82 | HR 96 | Temp 98.0°F | Resp 16 | Ht 68.0 in | Wt 234.8 lb

## 2017-07-15 DIAGNOSIS — E669 Obesity, unspecified: Secondary | ICD-10-CM | POA: Diagnosis not present

## 2017-07-15 DIAGNOSIS — Z0001 Encounter for general adult medical examination with abnormal findings: Secondary | ICD-10-CM

## 2017-07-15 DIAGNOSIS — Z803 Family history of malignant neoplasm of breast: Secondary | ICD-10-CM

## 2017-07-15 DIAGNOSIS — R232 Flushing: Secondary | ICD-10-CM

## 2017-07-15 DIAGNOSIS — Z13220 Encounter for screening for lipoid disorders: Secondary | ICD-10-CM

## 2017-07-15 NOTE — Patient Instructions (Signed)
It was great to see you!  Please speak with your ob-gyn about what we discussed today.  Stay safe in the storm!!  Health Maintenance, Female Adopting a healthy lifestyle and getting preventive care can go a long way to promote health and wellness. Talk with your health care provider about what schedule of regular examinations is right for you. This is a good chance for you to check in with your provider about disease prevention and staying healthy. In between checkups, there are plenty of things you can do on your own. Experts have done a lot of research about which lifestyle changes and preventive measures are most likely to keep you healthy. Ask your health care provider for more information. Weight and diet Eat a healthy diet  Be sure to include plenty of vegetables, fruits, low-fat dairy products, and lean protein.  Do not eat a lot of foods high in solid fats, added sugars, or salt.  Get regular exercise. This is one of the most important things you can do for your health. ? Most adults should exercise for at least 150 minutes each week. The exercise should increase your heart rate and make you sweat (moderate-intensity exercise). ? Most adults should also do strengthening exercises at least twice a week. This is in addition to the moderate-intensity exercise.  Maintain a healthy weight  Body mass index (BMI) is a measurement that can be used to identify possible weight problems. It estimates body fat based on height and weight. Your health care provider can help determine your BMI and help you achieve or maintain a healthy weight.  For females 35 years of age and older: ? A BMI below 18.5 is considered underweight. ? A BMI of 18.5 to 24.9 is normal. ? A BMI of 25 to 29.9 is considered overweight. ? A BMI of 30 and above is considered obese.  Watch levels of cholesterol and blood lipids  You should start having your blood tested for lipids and cholesterol at 50 years of age, then  have this test every 5 years.  You may need to have your cholesterol levels checked more often if: ? Your lipid or cholesterol levels are high. ? You are older than 50 years of age. ? You are at high risk for heart disease.  Cancer screening Lung Cancer  Lung cancer screening is recommended for adults 73-72 years old who are at high risk for lung cancer because of a history of smoking.  A yearly low-dose CT scan of the lungs is recommended for people who: ? Currently smoke. ? Have quit within the past 15 years. ? Have at least a 30-pack-year history of smoking. A pack year is smoking an average of one pack of cigarettes a day for 1 year.  Yearly screening should continue until it has been 15 years since you quit.  Yearly screening should stop if you develop a health problem that would prevent you from having lung cancer treatment.  Breast Cancer  Practice breast self-awareness. This means understanding how your breasts normally appear and feel.  It also means doing regular breast self-exams. Let your health care provider know about any changes, no matter how small.  If you are in your 20s or 30s, you should have a clinical breast exam (CBE) by a health care provider every 1-3 years as part of a regular health exam.  If you are 76 or older, have a CBE every year. Also consider having a breast X-ray (mammogram) every year.  If  you have a family history of breast cancer, talk to your health care provider about genetic screening.  If you are at high risk for breast cancer, talk to your health care provider about having an MRI and a mammogram every year.  Breast cancer gene (BRCA) assessment is recommended for women who have family members with BRCA-related cancers. BRCA-related cancers include: ? Breast. ? Ovarian. ? Tubal. ? Peritoneal cancers.  Results of the assessment will determine the need for genetic counseling and BRCA1 and BRCA2 testing.  Cervical Cancer Your health  care provider may recommend that you be screened regularly for cancer of the pelvic organs (ovaries, uterus, and vagina). This screening involves a pelvic examination, including checking for microscopic changes to the surface of your cervix (Pap test). You may be encouraged to have this screening done every 3 years, beginning at age 110.  For women ages 91-65, health care providers may recommend pelvic exams and Pap testing every 3 years, or they may recommend the Pap and pelvic exam, combined with testing for human papilloma virus (HPV), every 5 years. Some types of HPV increase your risk of cervical cancer. Testing for HPV may also be done on women of any age with unclear Pap test results.  Other health care providers may not recommend any screening for nonpregnant women who are considered low risk for pelvic cancer and who do not have symptoms. Ask your health care provider if a screening pelvic exam is right for you.  If you have had past treatment for cervical cancer or a condition that could lead to cancer, you need Pap tests and screening for cancer for at least 20 years after your treatment. If Pap tests have been discontinued, your risk factors (such as having a new sexual partner) need to be reassessed to determine if screening should resume. Some women have medical problems that increase the chance of getting cervical cancer. In these cases, your health care provider may recommend more frequent screening and Pap tests.  Colorectal Cancer  This type of cancer can be detected and often prevented.  Routine colorectal cancer screening usually begins at 50 years of age and continues through 50 years of age.  Your health care provider may recommend screening at an earlier age if you have risk factors for colon cancer.  Your health care provider may also recommend using home test kits to check for hidden blood in the stool.  A small camera at the end of a tube can be used to examine your colon  directly (sigmoidoscopy or colonoscopy). This is done to check for the earliest forms of colorectal cancer.  Routine screening usually begins at age 73.  Direct examination of the colon should be repeated every 5-10 years through 50 years of age. However, you may need to be screened more often if early forms of precancerous polyps or small growths are found.  Skin Cancer  Check your skin from head to toe regularly.  Tell your health care provider about any new moles or changes in moles, especially if there is a change in a mole's shape or color.  Also tell your health care provider if you have a mole that is larger than the size of a pencil eraser.  Always use sunscreen. Apply sunscreen liberally and repeatedly throughout the day.  Protect yourself by wearing long sleeves, pants, a wide-brimmed hat, and sunglasses whenever you are outside.  Heart disease, diabetes, and high blood pressure  High blood pressure causes heart disease and increases  the risk of stroke. High blood pressure is more likely to develop in: ? People who have blood pressure in the high end of the normal range (130-139/85-89 mm Hg). ? People who are overweight or obese. ? People who are African American.  If you are 72-28 years of age, have your blood pressure checked every 3-5 years. If you are 1 years of age or older, have your blood pressure checked every year. You should have your blood pressure measured twice-once when you are at a hospital or clinic, and once when you are not at a hospital or clinic. Record the average of the two measurements. To check your blood pressure when you are not at a hospital or clinic, you can use: ? An automated blood pressure machine at a pharmacy. ? A home blood pressure monitor.  If you are between 59 years and 56 years old, ask your health care provider if you should take aspirin to prevent strokes.  Have regular diabetes screenings. This involves taking a blood sample to  check your fasting blood sugar level. ? If you are at a normal weight and have a low risk for diabetes, have this test once every three years after 50 years of age. ? If you are overweight and have a high risk for diabetes, consider being tested at a younger age or more often. Preventing infection Hepatitis B  If you have a higher risk for hepatitis B, you should be screened for this virus. You are considered at high risk for hepatitis B if: ? You were born in a country where hepatitis B is common. Ask your health care provider which countries are considered high risk. ? Your parents were born in a high-risk country, and you have not been immunized against hepatitis B (hepatitis B vaccine). ? You have HIV or AIDS. ? You use needles to inject street drugs. ? You live with someone who has hepatitis B. ? You have had sex with someone who has hepatitis B. ? You get hemodialysis treatment. ? You take certain medicines for conditions, including cancer, organ transplantation, and autoimmune conditions.  Hepatitis C  Blood testing is recommended for: ? Everyone born from [birth year] through 1965. ? Anyone with known risk factors for hepatitis C.  Sexually transmitted infections (STIs)  You should be screened for sexually transmitted infections (STIs) including gonorrhea and chlamydia if: ? You are sexually active and are younger than 50 years of age. ? You are older than 50 years of age and your health care provider tells you that you are at risk for this type of infection. ? Your sexual activity has changed since you were last screened and you are at an increased risk for chlamydia or gonorrhea. Ask your health care provider if you are at risk.  If you do not have HIV, but are at risk, it may be recommended that you take a prescription medicine daily to prevent HIV infection. This is called pre-exposure prophylaxis (PrEP). You are considered at risk if: ? You are sexually active and do not regularly  use condoms or know the HIV status of your partner(s). ? You take drugs by injection. ? You are sexually active with a partner who has HIV.  Talk with your health care provider about whether you are at high risk of being infected with HIV. If you choose to begin PrEP, you should first be tested for HIV. You should then be tested every 3 months for as long as you are taking PrEP.  Pregnancy  If you are premenopausal and you may become pregnant, ask your health care provider about preconception counseling.  If you may become pregnant, take 400 to 800 micrograms (mcg) of folic acid every day.  If you want to prevent pregnancy, talk to your health care provider about birth control (contraception). Osteoporosis and menopause  Osteoporosis is a disease in which the bones lose minerals and strength with aging. This can result in serious bone fractures. Your risk for osteoporosis can be identified using a bone density scan.  If you are 36 years of age or older, or if you are at risk for osteoporosis and fractures, ask your health care provider if you should be screened.  Ask your health care provider whether you should take a calcium or vitamin D supplement to lower your risk for osteoporosis.  Menopause may have certain physical symptoms and risks.  Hormone replacement therapy may reduce some of these symptoms and risks. Talk to your health care provider about whether hormone replacement therapy is right for you. Follow these instructions at home:  Schedule regular health, dental, and eye exams.  Stay current with your immunizations.  Do not use any tobacco products including cigarettes, chewing tobacco, or electronic cigarettes.  If you are pregnant, do not drink alcohol.  If you are breastfeeding, limit how much and how often you drink alcohol.  Limit alcohol intake to no more than 1 drink per day for nonpregnant women. One drink equals 12 ounces of beer, 5 ounces of wine, or 1 ounces  of hard liquor.  Do not use street drugs.  Do not share needles.  Ask your health care provider for help if you need support or information about quitting drugs.  Tell your health care provider if you often feel depressed.  Tell your health care provider if you have ever been abused or do not feel safe at home. This information is not intended to replace advice given to you by your health care provider. Make sure you discuss any questions you have with your health care provider. Document Released: 05/05/2011 Document Revised: 03/27/2016 Document Reviewed: 07/24/2015 Elsevier Interactive Patient Education  [employer].

## 2017-07-15 NOTE — Progress Notes (Signed)
Subjective:    Debra Silva is a 51 y.o. female and is here for a comprehensive physical exam.   HPI  Health Maintenance Due  Topic Date Due  . PAP SMEAR  10/17/2016    Acute Concerns: Hot flashes -- has had for a few years, multiple times day doesn't matter what time during the day. Soaked in sweat multiple times a day and having terrible insomnia. Has talked to her Debra Silva, Debra Silva, about this in the past -- maybe 2-3 years ago and she did perform blood work and per patient said "all was fine" however she reports that Ms. Debra Silva was willing to start her on hormones, however patient opted to not start anything because she wanted to start something natural.   Chronic Issues: Family hx of breast cancer in mother -- mother passed away about 8 years ago 2/2 breast cancer. Patient is compliant with yearly mammograms. Denies hx of abnormal mammogram.  Obesity -- currently without scheduled exercise routine or healthy diet -- limits red meat, current weight is "very high for her"  Health Maintenance: Immunizations -- declined flu shot Colonoscopy -- early colonoscopy Sep 2012 -- had 2/2 rectal bleeding, will need to start routine screening after 51th birthday, 08/16/2017 Mammogram -- planned for 07/17/17 -- no abnormal PAP -- has had abnormal two pap smears Bone Density -- has not performed Diet -- rarely eats meat, she is lactose intolerant Caffeine intake -- no caffeine Sleep habits -- awful due to hot flashes, does snore, denies concerns for sleep apnea Exercise -- none schedule Weight -- Weight: 234 lb 12.8 oz (106.5 kg) -- very high for her Mood -- "it's okay", depression with mom's death --- 8 years ago, but gets down each holiday season Last period -- partial hysterectomy 2003   No flowsheet data found.  Other providers/specialists: Debra Silva -- ob-gyn    PMHx, SurgHx, SocialHx, Medications, and Allergies were reviewed in the Visit Navigator and updated as appropriate.    Past Medical History:  Diagnosis Date  . Migraine    hx of, resolved     Past Surgical History:  Procedure Laterality Date  . PARTIAL HYSTERECTOMY  2003     Family History  Problem Relation Age of Onset  . Breast cancer Mother 37  . Kidney disease Father   . Stroke Father   . Diabetes Maternal Grandmother        on insulin  . ALS Maternal Grandmother 83  . Hypertension Brother   . Colon cancer Neg Hx     Social History  Substance Use Topics  . Smoking status: Never Smoker  . Smokeless tobacco: Never Used  . Alcohol use No    Review of Systems:   Review of Systems  Constitutional: Negative for chills, fever, malaise/fatigue and weight loss.  HENT: Negative for hearing loss, sinus pain and sore throat.   Respiratory: Negative for cough, hemoptysis and shortness of breath.   Cardiovascular: Negative for chest pain, palpitations, orthopnea, claudication, leg swelling and PND.  Gastrointestinal: Negative for abdominal pain, constipation, diarrhea, heartburn, nausea and vomiting.  Genitourinary: Negative for dysuria, frequency and urgency.  Musculoskeletal: Negative for back pain, myalgias and neck pain.  Skin: Negative for itching and rash.  Neurological: Negative for dizziness, tingling, seizures and headaches.  Endo/Heme/Allergies: Negative for polydipsia.  Psychiatric/Behavioral: Negative for depression. The patient has insomnia. The patient is not nervous/anxious.     Objective:   BP 132/82 (BP Location: Right Arm, Patient Position: Sitting,  Cuff Size: Large)   Pulse 96   Temp 98 F (36.7 C) (Oral)   Resp 16   Ht 5\' 8"  (1.727 m)   Wt 234 lb 12.8 oz (106.5 kg)   SpO2 97%   BMI 35.70 kg/m  Body mass index is 35.7 kg/m.   General Appearance:    Alert, cooperative, no distress, appears stated age  Head:    Normocephalic, without obvious abnormality, atraumatic  Eyes:    PERRL, conjunctiva/corneas clear, EOM's intact, fundi    benign, both eyes  Ears:     Normal TM's and external ear canals, both ears  Nose:   Nares normal, septum midline, mucosa normal, no drainage    or sinus tenderness  Throat:   Lips, mucosa, and tongue normal; teeth and gums normal  Neck:   Supple, symmetrical, trachea midline, no adenopathy;    thyroid:  no enlargement/tenderness/nodules; no carotid   bruit or JVD  Back:     Symmetric, no curvature, ROM normal, no CVA tenderness  Lungs:     Clear to auscultation bilaterally, respirations unlabored  Chest Wall:    No tenderness or deformity   Heart:    Regular rate and rhythm, S1 and S2 normal, no murmur, rub   or gallop  Breast Exam:    Deferred  Abdomen:     Soft, non-tender, bowel sounds active all four quadrants,    no masses, no organomegaly  Genitalia:    Deferred  Extremities:   Extremities normal, atraumatic, no cyanosis or edema  Pulses:   2+ and symmetric all extremities  Skin:   Skin color, texture, turgor normal, no rashes or lesions  Lymph nodes:   Cervical, supraclavicular, and axillary nodes normal  Neurologic:   CNII-XII intact, normal strength, sensation and reflexes    throughout    Assessment/Plan:   Diagnoses and all orders for this visit:  Encounter for general adult medical examination with abnormal findings Today patient counseled on age appropriate routine health concerns for screening and prevention, each reviewed and up to date or declined. Immunizations reviewed and up to date or declined. Labs ordered and reviewed. Risk factors for depression reviewed and negative. Hearing function and visual acuity are intact. ADLs screened and addressed as needed. Functional ability and level of safety reviewed and appropriate. Education, counseling and referrals performed based on assessed risks today. Patient provided with a copy of personalized plan for preventive services. -     CBC with Differential/Platelet -     Comprehensive metabolic panel  Encounter for screening for lipid disorder -      Lipid panel  Hot flashes and Family history of breast cancer in mother Given hx of breast cancer in mom, I recommended further evaluation and treatment for her hot flashes by her Ob-Gyn, Debra Silva. Additionally, Ms. Debra Silva has done hormone testing in the past for patient and if she chooses to repeat these labs, they have these records for comparison. Discussed this with patient who was understanding and agreeable to this plan.  Obesity (BMI 30-39.9) Discussed routine exercise and need for healthy diet. Follow-up as desired to discuss weight loss.    Well Adult Exam: Labs ordered: Yes. Patient counseling was done. See below for items discussed. Discussed the patient's BMI.  The BMI BMI is not in the acceptable range; BMI management plan is completed Follow up as needed for acute illness. Breast cancer screening: uptodate, mammogram 07/16/17. Cervical cancer screening: s/p hysterectomy  Patient Counseling: [x]    Nutrition:  Stressed importance of moderation in sodium/caffeine intake, saturated fat and cholesterol, caloric balance, sufficient intake of fresh fruits, vegetables, fiber, calcium, iron, and 1 mg of folate supplement per day (for females capable of pregnancy).  [x]    Stressed the importance of regular exercise.   [x]    Substance Abuse: Discussed cessation/primary prevention of tobacco, alcohol, or other drug use; driving or other dangerous activities under the influence; availability of treatment for abuse.   [x]    Injury prevention: Discussed safety belts, safety helmets, smoke detector, smoking near bedding or upholstery.   []    Sexuality: Discussed sexually transmitted diseases, partner selection, use of condoms, avoidance of unintended pregnancy  and contraceptive alternatives.  [x]    Dental health: Discussed importance of regular tooth brushing, flossing, and dental visits.  [x]    Health maintenance and immunizations reviewed. Please refer to Health maintenance section.   CMA or LPN  served as scribe during this visit. History, Physical, and Plan performed by medical provider. Documentation and orders reviewed and attested to.  Inda Coke, PA-C [facility]

## 2017-07-16 ENCOUNTER — Other Ambulatory Visit: Payer: Self-pay | Admitting: Physician Assistant

## 2017-07-16 ENCOUNTER — Ambulatory Visit
Admission: RE | Admit: 2017-07-16 | Discharge: 2017-07-16 | Disposition: A | Discharge status: Home / Self Care | Payer: 59 | Source: Ambulatory Visit | Attending: Gynecology | Admitting: Gynecology

## 2017-07-16 DIAGNOSIS — Z1231 Encounter for screening mammogram for malignant neoplasm of breast: Secondary | ICD-10-CM | POA: Diagnosis not present

## 2017-07-16 DIAGNOSIS — R7989 Other specified abnormal findings of blood chemistry: Secondary | ICD-10-CM

## 2017-07-16 LAB — CBC WITH DIFFERENTIAL/PLATELET
BASOS ABS: 0.1 10*3/uL (ref 0.0–0.1)
Basophils Relative: 1 % (ref 0.0–3.0)
Eosinophils Absolute: 0.1 10*3/uL (ref 0.0–0.7)
Eosinophils Relative: 1.3 % (ref 0.0–5.0)
HCT: 43.9 % (ref 36.0–46.0)
Hemoglobin: 14.1 g/dL (ref 12.0–15.0)
LYMPHS ABS: 3.2 10*3/uL (ref 0.7–4.0)
Lymphocytes Relative: 37 % (ref 12.0–46.0)
MCHC: 32 g/dL (ref 30.0–36.0)
MCV: 80.4 fl (ref 78.0–100.0)
MONO ABS: 0.6 10*3/uL (ref 0.1–1.0)
MONOS PCT: 7.2 % (ref 3.0–12.0)
NEUTROS ABS: 4.6 10*3/uL (ref 1.4–7.7)
NEUTROS PCT: 53.5 % (ref 43.0–77.0)
PLATELETS: 250 10*3/uL (ref 150.0–400.0)
RBC: 5.47 Mil/uL — AB (ref 3.87–5.11)
RDW: 14 % (ref 11.5–15.5)
WBC: 8.5 10*3/uL (ref 4.0–10.5)

## 2017-07-16 LAB — LIPID PANEL
CHOL/HDL RATIO: 2
Cholesterol: 151 mg/dL (ref 0–200)
HDL: 68.8 mg/dL (ref >39.00)
NONHDL: 81.72
Triglycerides: 205 mg/dL — ABNORMAL HIGH (ref 0.0–149.0)
VLDL: 41 mg/dL — AB (ref 0.0–40.0)

## 2017-07-16 LAB — COMPREHENSIVE METABOLIC PANEL
ALT: 14 U/L (ref 0–35)
AST: 17 U/L (ref 0–37)
Albumin: 4.4 g/dL (ref 3.5–5.2)
Alkaline Phosphatase: 74 U/L (ref 39–117)
BUN: 15 mg/dL (ref 6–23)
CO2: 30 meq/L (ref 19–32)
Calcium: 10.4 mg/dL (ref 8.4–10.5)
Chloride: 103 mEq/L (ref 96–112)
Creatinine, Ser: 1.36 mg/dL — ABNORMAL HIGH (ref 0.40–1.20)
GFR: 52.95 mL/min — AB (ref >60.00)
GLUCOSE: 93 mg/dL (ref 70–99)
POTASSIUM: 4.6 meq/L (ref 3.5–5.1)
SODIUM: 139 meq/L (ref 135–145)
TOTAL PROTEIN: 7.2 g/dL (ref 6.0–8.3)
Total Bilirubin: 0.3 mg/dL (ref 0.2–1.2)

## 2017-07-16 LAB — HM MAMMOGRAPHY

## 2017-07-16 LAB — LDL CHOLESTEROL, DIRECT: LDL DIRECT: 68 mg/dL

## 2017-09-08 DIAGNOSIS — N951 Menopausal and female climacteric states: Secondary | ICD-10-CM | POA: Diagnosis not present

## 2017-09-08 DIAGNOSIS — Z01419 Encounter for gynecological examination (general) (routine) without abnormal findings: Secondary | ICD-10-CM | POA: Diagnosis not present

## 2017-09-11 ENCOUNTER — Encounter: Payer: Self-pay | Admitting: Physician Assistant

## 2018-02-18 ENCOUNTER — Encounter: Payer: Self-pay | Admitting: Sports Medicine

## 2018-02-18 ENCOUNTER — Ambulatory Visit: Payer: 59 | Admitting: Sports Medicine

## 2018-02-18 ENCOUNTER — Ambulatory Visit: Payer: Self-pay

## 2018-02-18 VITALS — BP 130/84 | HR 85 | Ht 68.0 in | Wt 227.6 lb

## 2018-02-18 DIAGNOSIS — M5431 Sciatica, right side: Secondary | ICD-10-CM | POA: Diagnosis not present

## 2018-02-18 DIAGNOSIS — M75102 Unspecified rotator cuff tear or rupture of left shoulder, not specified as traumatic: Secondary | ICD-10-CM

## 2018-02-18 DIAGNOSIS — M25512 Pain in left shoulder: Secondary | ICD-10-CM

## 2018-02-18 DIAGNOSIS — G2589 Other specified extrapyramidal and movement disorders: Secondary | ICD-10-CM | POA: Diagnosis not present

## 2018-02-18 NOTE — Patient Instructions (Addendum)
Please perform the exercise program that we have prepared for you and gone over in detail on a daily basis.  In addition to the handout you were provided you can access your program through: [email or web address]   Your unique program code is: [email or web address]   Also check out "[employer]" which is a program developed by Dr. Minerva Ends.   There are links to a couple of his YouTube Videos below and I would like to see performing one of his videos 5-6 days per week.    A good intro video is: "Independence from Pain 7-minute Video" - [email or web address]   His more advanced video is: "Powerful Posture and Pain Relief: 12 minutes of Foundation Training" - [email or web address]  Do not try to attempt this entire video when first beginning.    Try breaking of each exercise that he goes into shorter segments.  Otherwise if they perform an exercise for 45 seconds, start with 15 seconds and rest and then resume when they begin the new activity.    If you work your way up to doing this 12 minute video, I expect you will see significant improvements in your pain.  If you enjoy his videos and would like to find out more you can look on his website: [email or web address]  He has a workout streaming option as well as a DVD set available for purchase.  Amazon has the best price for his DVDs.

## 2018-02-18 NOTE — Procedures (Signed)
LIMITED MSK ULTRASOUND OF Left shoulder Images were obtained and interpreted by myself, Teresa Coombs, DO  Images have been saved and stored to PACS system. Images obtained on: GE S7 Ultrasound machine  FINDINGS:  Biceps Tendon: Normal Pec Major Insertio Normal Subscapularis Tendon: Normal Supraspinatus Tendon: Thickening with hypoechoic change but no overt full-thickness tear or even true interstitial splitting but there may be a small articular sided tear that is minimal.  No retraction. Infraspinatus/Teres Minor Tendon: Normal AC Joint: Small amount of the AC joint arthropathy but is minimal. JOINT: No significant GH spurring appreciated  LABRUM: Not evaluated    IMPRESSION:  1. Supraspinatus tendinopathy.

## 2018-02-18 NOTE — Progress Notes (Signed)
Debra Silva. [place], [place] [facility] [place]  Debra Silva - 52 y.o. female MRN [id number]  Date of birth: 1966/10/12  Visit Date: 02/18/2018  PCP: Inda Coke, PA   Referred by: Inda Coke, [place]  Scribe for today's visit: Josepha Pigg, CMA     SUBJECTIVE:  Debra Silva is here for Initial Assessment (L shoulder, LBP)  Her L shoulder pain symptoms INITIALLY: Began about a month ago and MOI is unknown.  Described as moderate numbness, radiating to her hands.  Worsened with sleeping on L side, movement Improved with rest Additional associated symptoms include: n/t in hands. She denies neck pain. No recent XR of L shoulder. It feels like "I got a shot".    At this time symptoms are worsening compared to onset. No medications or modalities have been tried.   Her LBP symptoms INITIALLY: Began about 2 weeks ago but she has hx of chronic LBP. She has had XR in the past.  Described as mild aching, nonradiating Worsened with lying down/sleeping.  Improved with standing.  Additional associated symptoms include: She has hx of R-sided sciatica.    At this time symptoms are improving compared to onset. She has been taking IBU 800 mg and Soma for the pain. She has gotten some relief from sx with this. She has also been chiropractor in the past with some relief (2 yrs ago).   ROS Denies night time disturbances. Denies fevers, chills, or night sweats. Denies unexplained weight loss. Denies personal history of cancer. Denies changes in bowel or bladder habits. Denies recent unreported falls. Denies new or worsening dyspnea or wheezing. Denies headaches or dizziness.  Denies numbness, tingling or weakness  In the extremities.  Denies dizziness or presyncopal episodes Denies lower extremity edema    HISTORY & PERTINENT PRIOR DATA:  Prior History reviewed and updated per electronic medical record.  Significant/pertinent  history, findings, studies include:  reports that she has never smoked. She has never used smokeless tobacco. No results for input(s): HGBA1C, LABURIC, CREATINE in the last 8760 hours. No specialty comments available. No problems updated.  OBJECTIVE:  VS:  HT:5\' 8"  (172.7 cm)   WT:227 lb 9.6 oz (103.2 kg)  BMI:34.61    BP:130/84  HR:85bpm  TEMP: ( )  RESP:97 %   PHYSICAL EXAM: Constitutional: WDWN, Non-toxic appearing. Psychiatric: Alert & appropriately interactive.  Not depressed or anxious appearing. Respiratory: No increased work of breathing.  Trachea Midline Eyes: Pupils are equal.  EOM intact without nystagmus.  No scleral icterus  Vascular Exam: warm to touch no edema  lower extremity neuro exam: unremarkable  MSK Exam: Somewhat limited overhead range of motion left shoulder.  She has poor scapular mobility on the left compared to the right with a protracted left shoulder.  She has slightly painful straight leg raise on the right localizing more to the buttock.  She has pain with greater sciatic notch palpation but this is minimal.  No pain with popliteal compression test.  Lower extremity strength and sensations intact light touch.  ASSESSMENT & PLAN:   1. Pain in joint of left shoulder   2. Sciatica of right side   3. Rotator cuff syndrome of left shoulder   4. Scapular dyskinesis     PLAN: Ultimately shoulder ultrasound does reveal slight thickening of the supraspinatus tendon but this is minimal.  No significant interstitial tearing.  She will benefit from home therapeutic exercises.  Discussed the  foundation of treatment for this condition is physical therapy and/or daily (5-6 days/week) therapeutic exercises, focusing on core strengthening, coordination, neuromuscular control/reeducation.  Therapeutic exercises prescribed per procedure note.  She has had some low back pain and discomfort and this is likely contributing to some of the anterior posture of her  shoulders as well.  Links to [employer] provided today per Patient Instructions.  These exercises were developed by Minerva Ends, DC with a strong emphasis on core neuromuscular reducation and postural realignment through body-weight exercises.  Follow-up: Return in about 6 weeks (around 04/01/2018).    PROCEDURE NOTE: THERAPEUTIC EXERCISES (97110) 15 minutes spent for Therapeutic exercises as below and as referenced in the AVS.  This included exercises focusing on stretching, strengthening, with significant focus on eccentric aspects.   Proper technique shown and discussed handout in great detail with ATC.  All questions were discussed and answered.   Long term goals include an improvement in range of motion, strength, endurance as well as avoiding reinjury. Frequency of visits is one time as determined during today's  office visit. Frequency of exercises to be performed is as per handout.  EXERCISES REVIEWED:  Intrinsic Rotator Cuff Exercises  Scapular Stabilization  Thoracic Mobility    Please see additional documentation for Objective, Assessment and Plan sections. Pertinent additional documentation may be included in corresponding procedure notes, imaging studies, problem based documentation and patient instructions. Please see these sections of the encounter for additional information regarding this visit.  CMA/ATC served as Education administrator during this visit. History, Physical, and Plan performed by medical provider. Documentation and orders reviewed and attested to.      Gerda Diss, [facility] Physician

## 2018-02-22 ENCOUNTER — Ambulatory Visit: Payer: 59 | Admitting: Physician Assistant

## 2018-03-12 ENCOUNTER — Encounter: Payer: Self-pay | Admitting: Sports Medicine

## 2018-04-01 ENCOUNTER — Ambulatory Visit: Payer: 59 | Admitting: Sports Medicine

## 2018-05-30 ENCOUNTER — Ambulatory Visit (HOSPITAL_COMMUNITY)
Admission: EM | Admit: 2018-05-30 | Discharge: 2018-05-30 | Disposition: A | Discharge status: Home / Self Care | Payer: 59 | Attending: Physician Assistant | Admitting: Physician Assistant

## 2018-05-30 ENCOUNTER — Encounter (HOSPITAL_COMMUNITY): Payer: Self-pay | Admitting: Emergency Medicine

## 2018-05-30 DIAGNOSIS — G8929 Other chronic pain: Secondary | ICD-10-CM | POA: Diagnosis not present

## 2018-05-30 DIAGNOSIS — M25561 Pain in right knee: Secondary | ICD-10-CM

## 2018-05-30 MED ORDER — NAPROXEN 500 MG PO TABS: 500.0000 mg | ORAL_TABLET | Freq: Two times a day (BID) | ORAL | 0 refills | Status: AC

## 2018-05-30 NOTE — ED Triage Notes (Signed)
Pt c/o bilateral leg pain for a few weeks, started off in both knees. Denies injury. Ambulatory with steady gait.

## 2018-05-30 NOTE — Discharge Instructions (Addendum)
Take tylenol 1000 mg three times a day for knee pain.  If you have pain pushing through and it is unbearable, then take a naproxen, which I have prescribed.  I'd try to get by on just one daily for a few days, if needed, then stop.

## 2018-05-30 NOTE — ED Provider Notes (Signed)
05/30/2018 4:45 PM   DOB: 01/28/1966 / MRN: [id number]  SUBJECTIVE:  Debra Silva is a 52 y.o. female presenting for right knee pain that is sharp, transient, and worse with ambulation and stair climbing.  She has a history of OA about the joint.  She is not a medicine person and has thus not tried any meds.   She has No Known Allergies.   She  has a past medical history of Migraine.    She  reports that she has never smoked. She has never used smokeless tobacco. She reports that she does not drink alcohol or use drugs. She  has no sexual activity history on file. The patient  has a past surgical history that includes Partial hysterectomy (2003).  Her family history includes ALS (age of onset: 54) in her maternal grandmother; Breast cancer (age of onset: 70) in her mother; Diabetes in her maternal grandmother; Hypertension in her brother; Kidney disease in her father; Stroke in her father.  Review of Systems  Constitutional: Negative for chills, diaphoresis and fever.  Cardiovascular: Negative for chest pain and leg swelling.  Gastrointestinal: Negative for nausea.  Musculoskeletal: Positive for joint pain. Negative for back pain, falls, myalgias and neck pain.  Skin: Negative for rash.  Neurological: Negative for dizziness.    OBJECTIVE:  BP (!) 145/95   Pulse 77   Temp 98.8 F (37.1 C)   Resp 16   SpO2 100%   Wt Readings from Last 3 Encounters:  02/18/18 227 lb 9.6 oz (103.2 kg)  07/15/17 234 lb 12.8 oz (106.5 kg)  08/26/16 234 lb 1.6 oz (106.2 kg)   Temp Readings from Last 3 Encounters:  05/30/18 98.8 F (37.1 C)  07/15/17 98 F (36.7 C) (Oral)  08/26/16 98.1 F (36.7 C) (Oral)   BP Readings from Last 3 Encounters:  05/30/18 (!) 145/95  02/18/18 130/84  07/15/17 132/82   Pulse Readings from Last 3 Encounters:  05/30/18 77  02/18/18 85  07/15/17 96    Physical Exam  Musculoskeletal: She exhibits no edema, tenderness or deformity.  Mild crepitus with patellar  grind but no pain. Ligamentous structures intact to challenge.  Negative McMurrays.  Negative TTP in the popliteal space and calf.  Negative Homan's sign.   Vitals reviewed.   No results found for this or any previous visit (from the past 72 hour(s)).  No results found.  ASSESSMENT AND PLAN:   Chronic pain of right knee - OA versus patellar tracking disorder.  Advised she talk with her PCP about a PT referral.    Discharge Instructions     Take tylenol 1000 mg three times a day for knee pain.  If you have pain pushing through and it is unbearable, then take a naproxen, which I have prescribed.  I'd try to get by on just one daily for a few days, if needed, then stop.         The patient is advised to call or return to clinic if she does not see an improvement in symptoms, or to seek the care of the closest emergency department if she worsens with the above plan.   Philis Fendt, MHS, PA-C 05/30/2018 4:45 PM   Tereasa Coop, PA-C 05/30/18 1645

## 2018-06-10 ENCOUNTER — Encounter: Payer: Self-pay | Admitting: Internal Medicine

## 2018-06-24 ENCOUNTER — Other Ambulatory Visit: Payer: Self-pay | Admitting: Gynecology

## 2018-06-24 DIAGNOSIS — Z1231 Encounter for screening mammogram for malignant neoplasm of breast: Secondary | ICD-10-CM

## 2018-06-28 ENCOUNTER — Encounter: Payer: Self-pay | Admitting: Internal Medicine

## 2018-06-30 ENCOUNTER — Encounter: Payer: 59 | Admitting: Physician Assistant

## 2018-07-19 ENCOUNTER — Encounter: Payer: 59 | Admitting: Physician Assistant

## 2018-07-19 ENCOUNTER — Ambulatory Visit: Payer: 59

## 2018-07-30 ENCOUNTER — Encounter: Payer: Self-pay | Admitting: Internal Medicine

## 2018-07-30 ENCOUNTER — Other Ambulatory Visit: Payer: Self-pay

## 2018-07-30 ENCOUNTER — Ambulatory Visit (AMBULATORY_SURGERY_CENTER): Payer: Self-pay | Admitting: *Deleted

## 2018-07-30 VITALS — Ht 69.0 in | Wt 227.0 lb

## 2018-07-30 DIAGNOSIS — Z1211 Encounter for screening for malignant neoplasm of colon: Secondary | ICD-10-CM

## 2018-07-30 MED ORDER — SUPREP BOWEL PREP KIT 17.5-3.13-1.6 GM/177ML PO SOLN: 1.0000 | Freq: Once | ORAL | 0 refills | Status: AC

## 2018-07-30 NOTE — Progress Notes (Signed)
No egg or soy allergy known to patient  No issues with past sedation with any surgeries  or procedures, no intubation problems  No diet pills per patient No home 02 use per patient  No blood thinners per patient  Pt denies issues with constipation  No A fib or A flutter  EMMI video offered and declned by the patient.

## 2018-08-04 ENCOUNTER — Ambulatory Visit (INDEPENDENT_AMBULATORY_CARE_PROVIDER_SITE_OTHER): Payer: 59 | Admitting: Physician Assistant

## 2018-08-04 ENCOUNTER — Encounter: Payer: Self-pay | Admitting: Physician Assistant

## 2018-08-04 VITALS — BP 110/72 | HR 80 | Temp 98.9°F | Ht 69.0 in | Wt 226.0 lb

## 2018-08-04 DIAGNOSIS — E669 Obesity, unspecified: Secondary | ICD-10-CM

## 2018-08-04 DIAGNOSIS — Z13220 Encounter for screening for lipoid disorders: Secondary | ICD-10-CM

## 2018-08-04 DIAGNOSIS — Z0000 Encounter for general adult medical examination without abnormal findings: Secondary | ICD-10-CM | POA: Diagnosis not present

## 2018-08-04 DIAGNOSIS — Z136 Encounter for screening for cardiovascular disorders: Secondary | ICD-10-CM | POA: Diagnosis not present

## 2018-08-04 DIAGNOSIS — R87810 Cervical high risk human papillomavirus (HPV) DNA test positive: Secondary | ICD-10-CM | POA: Insufficient documentation

## 2018-08-04 NOTE — Patient Instructions (Signed)
It was great to see you!  Please go to the lab for blood work.   Our office will call you with your results unless you have chosen to receive results via MyChart.  If your blood work is normal we will follow-up each year for physicals and as scheduled for chronic medical problems.  If anything is abnormal we will treat accordingly and get you in for a follow-up.  Take care,  Debra Silva   Health Maintenance, Female Adopting a healthy lifestyle and getting preventive care can go a long way to promote health and wellness. Talk with your health care provider about what schedule of regular examinations is right for you. This is a good chance for you to check in with your provider about disease prevention and staying healthy. In between checkups, there are plenty of things you can do on your own. Experts have done a lot of research about which lifestyle changes and preventive measures are most likely to keep you healthy. Ask your health care provider for more information. Weight and diet Eat a healthy diet  Be sure to include plenty of vegetables, fruits, low-fat dairy products, and lean protein.  Do not eat a lot of foods high in solid fats, added sugars, or salt.  Get regular exercise. This is one of the most important things you can do for your health. ? Most adults should exercise for at least 150 minutes each week. The exercise should increase your heart rate and make you sweat (moderate-intensity exercise). ? Most adults should also do strengthening exercises at least twice a week. This is in addition to the moderate-intensity exercise.  Maintain a healthy weight  Body mass index (BMI) is a measurement that can be used to identify possible weight problems. It estimates body fat based on height and weight. Your health care provider can help determine your BMI and help you achieve or maintain a healthy weight.  For females 20 years of age and older: ? A BMI below 18.5 is considered  underweight. ? A BMI of 18.5 to 24.9 is normal. ? A BMI of 25 to 29.9 is considered overweight. ? A BMI of 30 and above is considered obese.  Watch levels of cholesterol and blood lipids  You should start having your blood tested for lipids and cholesterol at 51 years of age, then have this test every 5 years.  You may need to have your cholesterol levels checked more often if: ? Your lipid or cholesterol levels are high. ? You are older than 50 years of age. ? You are at high risk for heart disease.  Cancer screening Lung Cancer  Lung cancer screening is recommended for adults 55-80 years old who are at high risk for lung cancer because of a history of smoking.  A yearly low-dose CT scan of the lungs is recommended for people who: ? Currently smoke. ? Have quit within the past 15 years. ? Have at least a 30-pack-year history of smoking. A pack year is smoking an average of one pack of cigarettes a day for 1 year.  Yearly screening should continue until it has been 15 years since you quit.  Yearly screening should stop if you develop a health problem that would prevent you from having lung cancer treatment.  Breast Cancer  Practice breast self-awareness. This means understanding how your breasts normally appear and feel.  It also means doing regular breast self-exams. Let your health care provider know about any changes, no matter how small.    If you are in your 20s or 30s, you should have a clinical breast exam (CBE) by a health care provider every 1-3 years as part of a regular health exam.  If you are 40 or older, have a CBE every year. Also consider having a breast X-ray (mammogram) every year.  If you have a family history of breast cancer, talk to your health care provider about genetic screening.  If you are at high risk for breast cancer, talk to your health care provider about having an MRI and a mammogram every year.  Breast cancer gene (BRCA) assessment is  recommended for women who have family members with BRCA-related cancers. BRCA-related cancers include: ? Breast. ? Ovarian. ? Tubal. ? Peritoneal cancers.  Results of the assessment will determine the need for genetic counseling and BRCA1 and BRCA2 testing.  Cervical Cancer Your health care provider may recommend that you be screened regularly for cancer of the pelvic organs (ovaries, uterus, and vagina). This screening involves a pelvic examination, including checking for microscopic changes to the surface of your cervix (Pap test). You may be encouraged to have this screening done every 3 years, beginning at age 21.  For women ages 30-65, health care providers may recommend pelvic exams and Pap testing every 3 years, or they may recommend the Pap and pelvic exam, combined with testing for human papilloma virus (HPV), every 5 years. Some types of HPV increase your risk of cervical cancer. Testing for HPV may also be done on women of any age with unclear Pap test results.  Other health care providers may not recommend any screening for nonpregnant women who are considered low risk for pelvic cancer and who do not have symptoms. Ask your health care provider if a screening pelvic exam is right for you.  If you have had past treatment for cervical cancer or a condition that could lead to cancer, you need Pap tests and screening for cancer for at least 20 years after your treatment. If Pap tests have been discontinued, your risk factors (such as having a new sexual partner) need to be reassessed to determine if screening should resume. Some women have medical problems that increase the chance of getting cervical cancer. In these cases, your health care provider may recommend more frequent screening and Pap tests.  Colorectal Cancer  This type of cancer can be detected and often prevented.  Routine colorectal cancer screening usually begins at 50 years of age and continues through 51 years of  age.  Your health care provider may recommend screening at an earlier age if you have risk factors for colon cancer.  Your health care provider may also recommend using home test kits to check for hidden blood in the stool.  A small camera at the end of a tube can be used to examine your colon directly (sigmoidoscopy or colonoscopy). This is done to check for the earliest forms of colorectal cancer.  Routine screening usually begins at age 50.  Direct examination of the colon should be repeated every 5-10 years through 51 years of age. However, you may need to be screened more often if early forms of precancerous polyps or small growths are found.  Skin Cancer  Check your skin from head to toe regularly.  Tell your health care provider about any new moles or changes in moles, especially if there is a change in a mole's shape or color.  Also tell your health care provider if you have a mole that is   larger than the size of a pencil eraser.  Always use sunscreen. Apply sunscreen liberally and repeatedly throughout the day.  Protect yourself by wearing long sleeves, pants, a wide-brimmed hat, and sunglasses whenever you are outside.  Heart disease, diabetes, and high blood pressure  High blood pressure causes heart disease and increases the risk of stroke. High blood pressure is more likely to develop in: ? People who have blood pressure in the high end of the normal range (130-139/85-89 mm Hg). ? People who are overweight or obese. ? People who are African American.  If you are 18-39 years of age, have your blood pressure checked every 3-5 years. If you are 40 years of age or older, have your blood pressure checked every year. You should have your blood pressure measured twice-once when you are at a hospital or clinic, and once when you are not at a hospital or clinic. Record the average of the two measurements. To check your blood pressure when you are not at a hospital or clinic, you  can use: ? An automated blood pressure machine at a pharmacy. ? A home blood pressure monitor.  If you are between 55 years and 79 years old, ask your health care provider if you should take aspirin to prevent strokes.  Have regular diabetes screenings. This involves taking a blood sample to check your fasting blood sugar level. ? If you are at a normal weight and have a low risk for diabetes, have this test once every three years after 51 years of age. ? If you are overweight and have a high risk for diabetes, consider being tested at a younger age or more often. Preventing infection Hepatitis B  If you have a higher risk for hepatitis B, you should be screened for this virus. You are considered at high risk for hepatitis B if: ? You were born in a country where hepatitis B is common. Ask your health care provider which countries are considered high risk. ? Your parents were born in a high-risk country, and you have not been immunized against hepatitis B (hepatitis B vaccine). ? You have HIV or AIDS. ? You use needles to inject street drugs. ? You live with someone who has hepatitis B. ? You have had sex with someone who has hepatitis B. ? You get hemodialysis treatment. ? You take certain medicines for conditions, including cancer, organ transplantation, and autoimmune conditions.  Hepatitis C  Blood testing is recommended for: ? Everyone born from 1945 through 1965. ? Anyone with known risk factors for hepatitis C.  Sexually transmitted infections (STIs)  You should be screened for sexually transmitted infections (STIs) including gonorrhea and chlamydia if: ? You are sexually active and are younger than 51 years of age. ? You are older than 51 years of age and your health care provider tells you that you are at risk for this type of infection. ? Your sexual activity has changed since you were last screened and you are at an increased risk for chlamydia or gonorrhea. Ask your  health care provider if you are at risk.  If you do not have HIV, but are at risk, it may be recommended that you take a prescription medicine daily to prevent HIV infection. This is called pre-exposure prophylaxis (PrEP). You are considered at risk if: ? You are sexually active and do not regularly use condoms or know the HIV status of your partner(s). ? You take drugs by injection. ? You are sexually active   with a partner who has HIV.  Talk with your health care provider about whether you are at high risk of being infected with HIV. If you choose to begin PrEP, you should first be tested for HIV. You should then be tested every 3 months for as long as you are taking PrEP. Pregnancy  If you are premenopausal and you may become pregnant, ask your health care provider about preconception counseling.  If you may become pregnant, take 400 to 800 micrograms (mcg) of folic acid every day.  If you want to prevent pregnancy, talk to your health care provider about birth control (contraception). Osteoporosis and menopause  Osteoporosis is a disease in which the bones lose minerals and strength with aging. This can result in serious bone fractures. Your risk for osteoporosis can be identified using a bone density scan.  If you are 20 years of age or older, or if you are at risk for osteoporosis and fractures, ask your health care provider if you should be screened.  Ask your health care provider whether you should take a calcium or vitamin D supplement to lower your risk for osteoporosis.  Menopause may have certain physical symptoms and risks.  Hormone replacement therapy may reduce some of these symptoms and risks. Talk to your health care provider about whether hormone replacement therapy is right for you. Follow these instructions at home:  Schedule regular health, dental, and eye exams.  Stay current with your immunizations.  Do not use any tobacco products including cigarettes, chewing  tobacco, or electronic cigarettes.  If you are pregnant, do not drink alcohol.  If you are breastfeeding, limit how much and how often you drink alcohol.  Limit alcohol intake to no more than 1 drink per day for nonpregnant women. One drink equals 12 ounces of beer, 5 ounces of wine, or 1 ounces of hard liquor.  Do not use street drugs.  Do not share needles.  Ask your health care provider for help if you need support or information about quitting drugs.  Tell your health care provider if you often feel depressed.  Tell your health care provider if you have ever been abused or do not feel safe at home. This information is not intended to replace advice given to you by your health care provider. Make sure you discuss any questions you have with your health care provider. Document Released: 05/05/2011 Document Revised: 03/27/2016 Document Reviewed: 07/24/2015 Elsevier Interactive Patient Education  [employer].

## 2018-08-04 NOTE — Progress Notes (Signed)
I acted as a Education administrator for [employer], PA-C Anselmo Pickler, LPN   Subjective:    Debra Silva is a 52 y.o. female and is here for a comprehensive physical exam.   HPI  Health Maintenance Due  Topic Date Due  . MAMMOGRAM  07/16/2018  . COLONOSCOPY  07/21/2018    Acute Concerns: None  Chronic Issues: Obesity -- continues to work on diet and exercise. Mostly vegetarian, tried veganism but was too restrictive. She tries to get her cardio via walking.  Health Maintenance: Immunizations -- UTD declined Flu Colonoscopy -- scheduled for Monday Oct 7th Mammogram -- UTD, scheduled 09/01/2018 PAP -- pt seeing Gyn in Nov will send reprot Bone Density -- N/A Diet -- mostly vegetarian Caffeine intake -- no sodas Sleep habits -- fair Exercise -- walking  Weight -- Weight: 226 lb (102.5 kg)  Mood -- feels more stable after stopping hormonal patch Weight history: Wt Readings from Last 10 Encounters:  08/04/18 226 lb (102.5 kg)  07/30/18 227 lb (103 kg)  02/18/18 227 lb 9.6 oz (103.2 kg)  07/15/17 234 lb 12.8 oz (106.5 kg)  08/26/16 234 lb 1.6 oz (106.2 kg)  10/31/15 239 lb 8 oz (108.6 kg)  03/05/15 235 lb 12 oz (106.9 kg)  10/25/14 231 lb (104.8 kg)  11/08/13 224 lb (101.6 kg)  10/12/13 227 lb (103 kg)   No LMP recorded. (Menstrual status: Other). Period characteristics: does not have Alcohol use: none Tobacco use: none  Depression screen PHQ 2/9 08/04/2018  Decreased Interest 0  Down, Depressed, Hopeless 0  PHQ - 2 Score 0    Other providers/specialists: UTD with dentist   PMHx, SurgHx, SocialHx, Medications, and Allergies were reviewed in the Visit Navigator and updated as appropriate.   Past Medical History:  Diagnosis Date  . Arthritis   . Migraine    hx of, resolved     Past Surgical History:  Procedure Laterality Date  . COLONOSCOPY    . PARTIAL HYSTERECTOMY  2003     Family History  Problem Relation Age of Onset  . Breast cancer Mother 36    . Kidney disease Father   . Stroke Father   . Diabetes Maternal Grandmother        on insulin  . ALS Maternal Grandmother 83  . Hypertension Brother   . Colon polyps Son   . Colon cancer Neg Hx   . Esophageal cancer Neg Hx   . Rectal cancer Neg Hx   . Stomach cancer Neg Hx     Social History   Tobacco Use  . Smoking status: Never Smoker  . Smokeless tobacco: Never Used  Substance Use Topics  . Alcohol use: No  . Drug use: No    Review of Systems:   Review of Systems  Constitutional: Negative for chills, fever, malaise/fatigue and weight loss.  HENT: Negative for hearing loss, sinus pain and sore throat.   Respiratory: Negative for cough and hemoptysis.   Cardiovascular: Negative for chest pain, palpitations, leg swelling and PND.  Gastrointestinal: Negative for abdominal pain, constipation, diarrhea, heartburn, nausea and vomiting.  Genitourinary: Negative for dysuria, frequency and urgency.  Musculoskeletal: Negative for back pain, myalgias and neck pain.  Skin: Negative for itching and rash.  Neurological: Negative for dizziness, tingling, seizures and headaches.  Endo/Heme/Allergies: Negative for polydipsia.  Psychiatric/Behavioral: Negative for depression. The patient is not nervous/anxious.     Objective:   BP 110/72 (BP Location: Left Arm, Patient Position: Sitting, Cuff  Size: Large)   Pulse 80   Temp 98.9 F (37.2 C) (Oral)   Ht 5\' 9"  (1.753 m)   Wt 226 lb (102.5 kg)   SpO2 97%   BMI 33.37 kg/m  Body mass index is 33.37 kg/m.   General Appearance:    Alert, cooperative, no distress, appears stated age  Head:    Normocephalic, without obvious abnormality, atraumatic  Eyes:    PERRL, conjunctiva/corneas clear, EOM's intact, fundi    benign, both eyes  Ears:    Normal TM's and external ear canals, both ears  Nose:   Nares normal, septum midline, mucosa normal, no drainage    or sinus tenderness  Throat:   Lips, mucosa, and tongue normal; teeth and gums  normal  Neck:   Supple, symmetrical, trachea midline, no adenopathy;    thyroid:  no enlargement/tenderness/nodules; no carotid   bruit or JVD  Back:     Symmetric, no curvature, ROM normal, no CVA tenderness  Lungs:     Clear to auscultation bilaterally, respirations unlabored  Chest Wall:    No tenderness or deformity   Heart:    Regular rate and rhythm, S1 and S2 normal, no murmur, rub   or gallop  Breast Exam:    Deferred  Abdomen:     Soft, non-tender, bowel sounds active all four quadrants,    no masses, no organomegaly  Genitalia:    Deferred  Extremities:   Extremities normal, atraumatic, no cyanosis or edema  Pulses:   2+ and symmetric all extremities  Skin:   Skin color, texture, turgor normal, no rashes or lesions  Lymph nodes:   Cervical, supraclavicular, and axillary nodes normal  Neurologic:   CNII-XII intact, normal strength, sensation and reflexes    throughout    Assessment/Plan:   Catalia was seen today for annual exam.  Diagnoses and all orders for this visit:  Routine physical examination Today patient counseled on age appropriate routine health concerns for screening and prevention, each reviewed and up to date or declined. Immunizations reviewed and up to date or declined. Labs ordered and reviewed. Risk factors for depression reviewed and negative. Hearing function and visual acuity are intact. ADLs screened and addressed as needed. Functional ability and level of safety reviewed and appropriate. Education, counseling and referrals performed based on assessed risks today. Patient provided with a copy of personalized plan for preventive services.  Obesity (BMI 30-39.9) Making efforts on diet and exercise, continue to encourage. Discussed needing to increase physical activity as able. -     CBC with Differential/Platelet -     Comprehensive metabolic panel  Encounter for lipid screening for cardiovascular disease -     Lipid panel   Well Adult Exam: Labs  ordered: Yes. Patient counseling was done. See below for items discussed. Discussed the patient's BMI.  The BMI BMI is not in the acceptable range; BMI management plan is completed Follow up in one year. Breast cancer screening: pending. Cervical cancer screening: pending   Patient Counseling: [x]    Nutrition: Stressed importance of moderation in sodium/caffeine intake, saturated fat and cholesterol, caloric balance, sufficient intake of fresh fruits, vegetables, fiber, calcium, iron, and 1 mg of folate supplement per day (for females capable of pregnancy).  [x]    Stressed the importance of regular exercise.   [x]    Substance Abuse: Discussed cessation/primary prevention of tobacco, alcohol, or other drug use; driving or other dangerous activities under the influence; availability of treatment for abuse.   [x]   Injury prevention: Discussed safety belts, safety helmets, smoke detector, smoking near bedding or upholstery.   [x]    Sexuality: Discussed sexually transmitted diseases, partner selection, use of condoms, avoidance of unintended pregnancy  and contraceptive alternatives.  [x]    Dental health: Discussed importance of regular tooth brushing, flossing, and dental visits.  [x]    Health maintenance and immunizations reviewed. Please refer to Health maintenance section.   CMA or LPN served as scribe during this visit. History, Physical, and Plan performed by medical provider. The above documentation has been reviewed and is accurate and complete.  Inda Coke, PA-C [place]

## 2018-08-05 ENCOUNTER — Encounter: Payer: Self-pay | Admitting: Physician Assistant

## 2018-08-05 LAB — COMPREHENSIVE METABOLIC PANEL
ALBUMIN: 4.3 g/dL (ref 3.5–5.2)
ALK PHOS: 64 U/L (ref 39–117)
ALT: 14 U/L (ref 0–35)
AST: 18 U/L (ref 0–37)
BUN: 10 mg/dL (ref 6–23)
CO2: 31 mEq/L (ref 19–32)
CREATININE: 1.1 mg/dL (ref 0.40–1.20)
Calcium: 10.5 mg/dL (ref 8.4–10.5)
Chloride: 103 mEq/L (ref 96–112)
GFR: 67.35 mL/min (ref >60.00)
GLUCOSE: 79 mg/dL (ref 70–99)
POTASSIUM: 4.1 meq/L (ref 3.5–5.1)
Sodium: 138 mEq/L (ref 135–145)
TOTAL PROTEIN: 7.2 g/dL (ref 6.0–8.3)
Total Bilirubin: 0.5 mg/dL (ref 0.2–1.2)

## 2018-08-05 LAB — CBC WITH DIFFERENTIAL/PLATELET
BASOS ABS: 0.1 10*3/uL (ref 0.0–0.1)
Basophils Relative: 0.9 % (ref 0.0–3.0)
EOS ABS: 0.1 10*3/uL (ref 0.0–0.7)
EOS PCT: 0.6 % (ref 0.0–5.0)
HCT: 42 % (ref 36.0–46.0)
HEMOGLOBIN: 13.6 g/dL (ref 12.0–15.0)
LYMPHS ABS: 2.9 10*3/uL (ref 0.7–4.0)
Lymphocytes Relative: 31.6 % (ref 12.0–46.0)
MCHC: 32.3 g/dL (ref 30.0–36.0)
MCV: 79.5 fl (ref 78.0–100.0)
Monocytes Absolute: 0.6 10*3/uL (ref 0.1–1.0)
Monocytes Relative: 6.3 % (ref 3.0–12.0)
NEUTROS PCT: 60.6 % (ref 43.0–77.0)
Neutro Abs: 5.6 10*3/uL (ref 1.4–7.7)
Platelets: 257 10*3/uL (ref 150.0–400.0)
RBC: 5.28 Mil/uL — ABNORMAL HIGH (ref 3.87–5.11)
RDW: 13.9 % (ref 11.5–15.5)
WBC: 9.3 10*3/uL (ref 4.0–10.5)

## 2018-08-05 LAB — LIPID PANEL
CHOL/HDL RATIO: 2
Cholesterol: 149 mg/dL (ref 0–200)
HDL: 62 mg/dL (ref >39.00)
LDL CALC: 66 mg/dL (ref 0–99)
NonHDL: 87.18
Triglycerides: 104 mg/dL (ref 0.0–149.0)
VLDL: 20.8 mg/dL (ref 0.0–40.0)

## 2018-08-09 ENCOUNTER — Encounter: Payer: Self-pay | Admitting: Internal Medicine

## 2018-08-09 ENCOUNTER — Ambulatory Visit (AMBULATORY_SURGERY_CENTER): Payer: 59 | Admitting: Internal Medicine

## 2018-08-09 VITALS — BP 135/95 | HR 67 | Temp 98.2°F | Resp 24 | Ht 69.0 in | Wt 226.0 lb

## 2018-08-09 DIAGNOSIS — Z1211 Encounter for screening for malignant neoplasm of colon: Secondary | ICD-10-CM

## 2018-08-09 MED ORDER — SODIUM CHLORIDE 0.9 % IV SOLN: 500.0000 mL | Freq: Once | INTRAVENOUS | Status: DC

## 2018-08-09 NOTE — Progress Notes (Signed)
Pt's states no medical or surgical changes since previsit or office visit. 

## 2018-08-09 NOTE — Patient Instructions (Signed)
YOU HAD AN ENDOSCOPIC PROCEDURE TODAY AT THE [facility]:   Refer to the procedure report that was given to you for any specific questions about what was found during the examination.  If the procedure report does not answer your questions, please call your gastroenterologist to clarify.  If you requested that your care partner not be given the details of your procedure findings, then the procedure report has been included in a sealed envelope for you to review at your convenience later.  YOU SHOULD EXPECT: Some feelings of bloating in the abdomen. Passage of more gas than usual.  Walking can help get rid of the air that was put into your GI tract during the procedure and reduce the bloating. If you had a lower endoscopy (such as a colonoscopy or flexible sigmoidoscopy) you may notice spotting of blood in your stool or on the toilet paper. If you underwent a bowel prep for your procedure, you may not have a normal bowel movement for a few days.  Please Note:  You might notice some irritation and congestion in your nose or some drainage.  This is from the oxygen used during your procedure.  There is no need for concern and it should clear up in a day or so.  SYMPTOMS TO REPORT IMMEDIATELY:   Following lower endoscopy (colonoscopy or flexible sigmoidoscopy):  Excessive amounts of blood in the stool  Significant tenderness or worsening of abdominal pains  Swelling of the abdomen that is new, acute  Fever of 100F or higher  For urgent or emergent issues, a gastroenterologist can be reached at any hour by calling [phone].   DIET:  We do recommend a small meal at first, but then you may proceed to your regular diet.  Drink plenty of fluids but you should avoid alcoholic beverages for 24 hours.  ACTIVITY:  You should plan to take it easy for the rest of today and you should NOT DRIVE or use heavy machinery until tomorrow (because of the sedation medicines used during the test).     FOLLOW UP: Our staff will call the number listed on your records the next business day following your procedure to check on you and address any questions or concerns that you may have regarding the information given to you following your procedure. If we do not reach you, we will leave a message.  However, if you are feeling well and you are not experiencing any problems, there is no need to return our call.  We will assume that you have returned to your regular daily activities without incident.  If any biopsies were taken you will be contacted by phone or by letter within the next 1-3 weeks.  Please call us at [phone] if you have not heard about the biopsies in 3 weeks.    SIGNATURES/CONFIDENTIALITY: You and/or your care partner have signed paperwork which will be entered into your electronic medical record.  These signatures attest to the fact that that the information above on your After Visit Summary has been reviewed and is understood.  Full responsibility of the confidentiality of this discharge information lies with you and/or your care-partner. 

## 2018-08-09 NOTE — Progress Notes (Signed)
Report given to PACU, vss 

## 2018-08-09 NOTE — Op Note (Signed)
[facility] Patient Name: Debra Silva Procedure Date: 08/09/2018 3:03 PM MRN: [id number] Endoscopist: Jerene Bears , MD Age: 52 Referring MD:  Date of Birth: 1966-08-25 Gender: Female Account #: [id number] Procedure:                Colonoscopy Indications:              Screening for colorectal malignant neoplasm Medicines:                Monitored Anesthesia Care Procedure:                Pre-Anesthesia Assessment:                           - Prior to the procedure, a History and Physical                            was performed, and patient medications and                            allergies were reviewed. The patient's tolerance of                            previous anesthesia was also reviewed. The risks                            and benefits of the procedure and the sedation                            options and risks were discussed with the patient.                            All questions were answered, and informed consent                            was obtained. Prior Anticoagulants: The patient has                            taken no previous anticoagulant or antiplatelet                            agents. ASA Grade Assessment: II - A patient with                            mild systemic disease. After reviewing the risks                            and benefits, the patient was deemed in                            satisfactory condition to undergo the procedure.                           After obtaining informed consent, the colonoscope  was passed under direct vision. Throughout the                            procedure, the patient's blood pressure, pulse, and                            oxygen saturations were monitored continuously. The                            Colonoscope was introduced through the anus and                            advanced to the cecum, identified by appendiceal                            orifice and ileocecal  valve. The colonoscopy was                            performed without difficulty. The patient tolerated                            the procedure well. The quality of the bowel                            preparation was good. The ileocecal valve,                            appendiceal orifice, and rectum were photographed. Scope In: 3:16:35 PM Scope Out: 3:30:02 PM Scope Withdrawal Time: 0 hours 10 minutes 8 seconds  Total Procedure Duration: 0 hours 13 minutes 27 seconds  Findings:                 The digital rectal exam was normal.                           The entire examined colon appeared normal on direct                            and retroflexion views. Complications:            No immediate complications. Estimated Blood Loss:     Estimated blood loss: none. Impression:               - The entire examined colon is normal on direct and                            retroflexion views.                           - No specimens collected. Recommendation:           - Patient has a contact number available for                            emergencies. The signs and symptoms of potential  delayed complications were discussed with the                            patient. Return to normal activities tomorrow.                            Written discharge instructions were provided to the                            patient.                           - Resume previous diet.                           - Continue present medications.                           - Repeat colonoscopy in 10 years for screening                            purposes. Jerene Bears, MD 08/09/2018 3:32:32 PM This report has been signed electronically.

## 2018-08-10 ENCOUNTER — Telehealth: Payer: Self-pay

## 2018-08-10 NOTE — Telephone Encounter (Signed)
No answer, left message to call back later, B.Samary Shatz RN. 

## 2018-08-10 NOTE — Telephone Encounter (Signed)
Left message

## 2018-09-01 ENCOUNTER — Ambulatory Visit
Admission: RE | Admit: 2018-09-01 | Discharge: 2018-09-01 | Disposition: A | Discharge status: Home / Self Care | Payer: 59 | Source: Ambulatory Visit | Attending: Gynecology | Admitting: Gynecology

## 2018-09-01 DIAGNOSIS — Z1231 Encounter for screening mammogram for malignant neoplasm of breast: Secondary | ICD-10-CM | POA: Diagnosis not present

## 2018-09-16 DIAGNOSIS — Z01411 Encounter for gynecological examination (general) (routine) with abnormal findings: Secondary | ICD-10-CM | POA: Diagnosis not present

## 2018-09-16 DIAGNOSIS — N951 Menopausal and female climacteric states: Secondary | ICD-10-CM | POA: Diagnosis not present

## 2018-09-16 DIAGNOSIS — Z6835 Body mass index (BMI) 35.0-35.9, adult: Secondary | ICD-10-CM | POA: Diagnosis not present

## 2019-06-16 ENCOUNTER — Encounter: Payer: Self-pay | Admitting: Physician Assistant

## 2019-06-16 NOTE — Progress Notes (Signed)
Error

## 2019-07-18 ENCOUNTER — Other Ambulatory Visit: Payer: Self-pay | Admitting: Gynecology

## 2019-07-18 DIAGNOSIS — Z1231 Encounter for screening mammogram for malignant neoplasm of breast: Secondary | ICD-10-CM

## 2019-07-25 ENCOUNTER — Encounter: Payer: 59 | Admitting: Physician Assistant

## 2019-08-17 ENCOUNTER — Other Ambulatory Visit: Payer: Self-pay

## 2019-08-17 ENCOUNTER — Ambulatory Visit (INDEPENDENT_AMBULATORY_CARE_PROVIDER_SITE_OTHER): Payer: 59 | Admitting: Physician Assistant

## 2019-08-17 ENCOUNTER — Encounter: Payer: Self-pay | Admitting: Physician Assistant

## 2019-08-17 VITALS — BP 122/90 | HR 74 | Temp 97.3°F | Ht 69.0 in | Wt 234.0 lb

## 2019-08-17 DIAGNOSIS — Z0001 Encounter for general adult medical examination with abnormal findings: Secondary | ICD-10-CM

## 2019-08-17 DIAGNOSIS — E669 Obesity, unspecified: Secondary | ICD-10-CM | POA: Diagnosis not present

## 2019-08-17 DIAGNOSIS — Z13220 Encounter for screening for lipoid disorders: Secondary | ICD-10-CM

## 2019-08-17 DIAGNOSIS — Z136 Encounter for screening for cardiovascular disorders: Secondary | ICD-10-CM

## 2019-08-17 DIAGNOSIS — R3915 Urgency of urination: Secondary | ICD-10-CM

## 2019-08-17 LAB — COMPREHENSIVE METABOLIC PANEL
ALT: 15 U/L (ref 0–35)
AST: 18 U/L (ref 0–37)
Albumin: 4.3 g/dL (ref 3.5–5.2)
Alkaline Phosphatase: 67 U/L (ref 39–117)
BUN: 9 mg/dL (ref 6–23)
CO2: 29 mEq/L (ref 19–32)
Calcium: 10 mg/dL (ref 8.4–10.5)
Chloride: 103 mEq/L (ref 96–112)
Creatinine, Ser: 1.1 mg/dL (ref 0.40–1.20)
GFR: 63.11 mL/min (ref >60.00)
Glucose, Bld: 103 mg/dL — ABNORMAL HIGH (ref 70–99)
Potassium: 4.4 mEq/L (ref 3.5–5.1)
Sodium: 139 mEq/L (ref 135–145)
Total Bilirubin: 0.5 mg/dL (ref 0.2–1.2)
Total Protein: 6.9 g/dL (ref 6.0–8.3)

## 2019-08-17 LAB — CBC WITH DIFFERENTIAL/PLATELET
Basophils Absolute: 0 10*3/uL (ref 0.0–0.1)
Basophils Relative: 0.6 % (ref 0.0–3.0)
Eosinophils Absolute: 0.1 10*3/uL (ref 0.0–0.7)
Eosinophils Relative: 1.5 % (ref 0.0–5.0)
HCT: 44.5 % (ref 36.0–46.0)
Hemoglobin: 14.5 g/dL (ref 12.0–15.0)
Lymphocytes Relative: 36.7 % (ref 12.0–46.0)
Lymphs Abs: 2.6 10*3/uL (ref 0.7–4.0)
MCHC: 32.5 g/dL (ref 30.0–36.0)
MCV: 80.2 fl (ref 78.0–100.0)
Monocytes Absolute: 0.4 10*3/uL (ref 0.1–1.0)
Monocytes Relative: 5.8 % (ref 3.0–12.0)
Neutro Abs: 3.9 10*3/uL (ref 1.4–7.7)
Neutrophils Relative %: 55.4 % (ref 43.0–77.0)
Platelets: 247 10*3/uL (ref 150.0–400.0)
RBC: 5.55 Mil/uL — ABNORMAL HIGH (ref 3.87–5.11)
RDW: 14.3 % (ref 11.5–15.5)
WBC: 7 10*3/uL (ref 4.0–10.5)

## 2019-08-17 LAB — LIPID PANEL
Cholesterol: 147 mg/dL (ref 0–200)
HDL: 59.6 mg/dL (ref >39.00)
LDL Cholesterol: 67 mg/dL (ref 0–99)
NonHDL: 87.61
Total CHOL/HDL Ratio: 2
Triglycerides: 105 mg/dL (ref 0.0–149.0)
VLDL: 21 mg/dL (ref 0.0–40.0)

## 2019-08-17 LAB — HEMOGLOBIN A1C: Hgb A1c MFr Bld: 6.1 % (ref 4.6–6.5)

## 2019-08-17 NOTE — Progress Notes (Signed)
Subjective:    Debra Silva is a 53 y.o. female and is here for a comprehensive physical exam.  HPI  There are no preventive care reminders to display for this patient.  Acute Concerns: Urinary urgency --patient reports that she is having symptoms of urgency, that are worsening with time.  Has had symptoms for at least 1 year.  She feels like she absolutely has to stop what she is doing to run to the restroom.  She had does not have any incontinence.  But she is fearful that is coming if she does not get control of her urgency at this time.  Denies any vaginal symptoms, constipation, dehydration, long periods of holding her bladder.  Chronic Issues: Obesity --has had weight gain due to Covid.  She is spending more time eating at home.  She does walk 4 miles at least 3 to 4 days a week.  This is often a brisk walk.  She does not want to start any medications.  Health Maintenance: Immunizations -- declines flu Colonoscopy -- had in 2019; due in 2029 Mammogram -- scheduled in Nov PAP -- has follow-up with GYN in Jan Diet -- variable, eating a lot more at home Sleep habits -- no issues Exercise -- walks at least 4 miles a few times a week Current Weight -- Weight: 234 lb (106.1 kg)  Weight History: Wt Readings from Last 10 Encounters:  08/17/19 234 lb (106.1 kg)  08/09/18 226 lb (102.5 kg)  08/04/18 226 lb (102.5 kg)  07/30/18 227 lb (103 kg)  02/18/18 227 lb 9.6 oz (103.2 kg)  07/15/17 234 lb 12.8 oz (106.5 kg)  08/26/16 234 lb 1.6 oz (106.2 kg)  10/31/15 239 lb 8 oz (108.6 kg)  03/05/15 235 lb 12 oz (106.9 kg)  10/25/14 231 lb (104.8 kg)   Mood -- overall good No LMP recorded. (Menstrual status: Other).   Depression screen PHQ 2/9 08/04/2018  Decreased Interest 0  Down, Depressed, Hopeless 0  PHQ - 2 Score 0     Other providers/specialists: Patient Care Team: Inda Coke, [place] as PCP - General (Physician Assistant) Pyrtle, Lajuan Lines, MD (Gastroenterology) Key,  Nelia Shi, NP as Nurse Practitioner (Gynecology)   PMHx, SurgHx, SocialHx, Medications, and Allergies were reviewed in the Visit Navigator and updated as appropriate.   Past Medical History:  Diagnosis Date   Arthritis    Migraine    hx of, resolved     Past Surgical History:  Procedure Laterality Date   COLONOSCOPY     PARTIAL HYSTERECTOMY  2003     Family History  Problem Relation Age of Onset   Breast cancer Mother 18   Kidney disease Father    Stroke Father    Diabetes Maternal Grandmother        on insulin   ALS Maternal Grandmother 83   Hypertension Brother    Colon polyps Son    Colon cancer Neg Hx    Esophageal cancer Neg Hx    Rectal cancer Neg Hx    Stomach cancer Neg Hx     Social History   Tobacco Use   Smoking status: Never Smoker   Smokeless tobacco: Never Used  Substance Use Topics   Alcohol use: No   Drug use: No    Review of Systems:   Review of Systems  Constitutional: Negative for chills, fever, malaise/fatigue and weight loss.  HENT: Negative for hearing loss, sinus pain and sore throat.   Respiratory: Negative  for cough and hemoptysis.   Cardiovascular: Negative for chest pain, palpitations, leg swelling and PND.  Gastrointestinal: Negative for abdominal pain, constipation, diarrhea, heartburn, nausea and vomiting.  Genitourinary: Positive for urgency. Negative for dysuria and frequency.  Musculoskeletal: Negative for back pain, myalgias and neck pain.  Skin: Negative for itching and rash.  Neurological: Negative for dizziness, tingling, seizures and headaches.  Endo/Heme/Allergies: Negative for polydipsia.  Psychiatric/Behavioral: Negative for depression. The patient is not nervous/anxious.     Objective:   BP 122/90    Pulse 74    Temp (!) 97.3 F (36.3 C)    Ht 5\' 9"  (1.753 m)    Wt 234 lb (106.1 kg)    SpO2 99%    BMI 34.56 kg/m   General Appearance:    Alert, cooperative, no distress, appears stated age    Head:    Normocephalic, without obvious abnormality, atraumatic  Eyes:    PERRL, conjunctiva/corneas clear, EOM's intact, fundi    benign, both eyes  Ears:    Normal TM's and external ear canals, both ears  Nose:   Nares normal, septum midline, mucosa normal, no drainage    or sinus tenderness  Throat:   Lips, mucosa, and tongue normal; teeth and gums normal  Neck:   Supple, symmetrical, trachea midline, no adenopathy;    thyroid:  no enlargement/tenderness/nodules; no carotid   bruit or JVD  Back:     Symmetric, no curvature, ROM normal, no CVA tenderness  Lungs:     Clear to auscultation bilaterally, respirations unlabored  Chest Wall:    No tenderness or deformity   Heart:    Regular rate and rhythm, S1 and S2 normal, no murmur, rub   or gallop  Breast Exam:    Deferred  Abdomen:     Soft, non-tender, bowel sounds active all four quadrants,    no masses, no organomegaly  Genitalia:    Deferred  Rectal:    Deferred  Extremities:   Extremities normal, atraumatic, no cyanosis or edema  Pulses:   2+ and symmetric all extremities  Skin:   Skin color, texture, turgor normal, no rashes or lesions  Lymph nodes:   Cervical, supraclavicular, and axillary nodes normal  Neurologic:   CNII-XII intact, normal strength, sensation and reflexes    throughout    Assessment/Plan:   Kayman was seen today for annual exam.  Diagnoses and all orders for this visit:  Encounter for general adult medical examination with abnormal findings Today patient counseled on age appropriate routine health concerns for screening and prevention, each reviewed and up to date or declined. Immunizations reviewed and up to date or declined. Labs ordered and reviewed. Risk factors for depression reviewed and negative. Hearing function and visual acuity are intact. ADLs screened and addressed as needed. Functional ability and level of safety reviewed and appropriate. Education, counseling and referrals performed based on  assessed risks today. Patient provided with a copy of personalized plan for preventive services.  Obesity (BMI 30-39.9) Encouraged healthier eating, better snack/grazing choices at home. Declines medication. Will check HgbA1c given weight gain and increase in eating. -     CBC with Differential/Platelet -     Comprehensive metabolic panel -     Hemoglobin A1c  Encounter for lipid screening for cardiovascular disease -     Lipid panel  Urinary urgency Uncontrolled. Provided handout on bladder training. Discussed that if this is unsuccessful for her, that we can trial pelvic floor PT.   Well  Adult Exam: Labs ordered: Yes. Patient counseling was done. See below for items discussed. Discussed the patient's BMI. The BMI is not in the acceptable range; BMI management plan is completed Follow up in one year.  Patient Counseling:   [x]     Nutrition: Stressed importance of moderation in sodium/caffeine intake, saturated fat and cholesterol, caloric balance, sufficient intake of fresh fruits, vegetables, fiber, calcium, iron, and 1 mg of folate supplement per day (for females capable of pregnancy).   [x]      Stressed the importance of regular exercise.    [x]     Substance Abuse: Discussed cessation/primary prevention of tobacco, alcohol, or other drug use; driving or other dangerous activities under the influence; availability of treatment for abuse.    [x]      Injury prevention: Discussed safety belts, safety helmets, smoke detector, smoking near bedding or upholstery.    [x]      Sexuality: Discussed sexually transmitted diseases, partner selection, use of condoms, avoidance of unintended pregnancy  and contraceptive alternatives.    [x]     Dental health: Discussed importance of regular tooth brushing, flossing, and dental visits.   [x]      Health maintenance and immunizations reviewed. Please refer to Health maintenance section.   Inda Coke, PA-C [place]

## 2019-08-17 NOTE — Patient Instructions (Signed)
It was great to see you!  Please go to the lab for blood work.   Our office will call you with your results unless you have chosen to receive results via MyChart.  If your blood work is normal we will follow-up each year for physicals and as scheduled for chronic medical problems.  If anything is abnormal we will treat accordingly and get you in for a follow-up.  Take care,  Debra Silva    Health Maintenance, Female Adopting a healthy lifestyle and getting preventive care are important in promoting health and wellness. Ask your health care provider about:  The right schedule for you to have regular tests and exams.  Things you can do on your own to prevent diseases and keep yourself healthy. What should I know about diet, weight, and exercise? Eat a healthy diet   Eat a diet that includes plenty of vegetables, fruits, low-fat dairy products, and lean protein.  Do not eat a lot of foods that are high in solid fats, added sugars, or sodium. Maintain a healthy weight Body mass index (BMI) is used to identify weight problems. It estimates body fat based on height and weight. Your health care provider can help determine your BMI and help you achieve or maintain a healthy weight. Get regular exercise Get regular exercise. This is one of the most important things you can do for your health. Most adults should:  Exercise for at least 150 minutes each week. The exercise should increase your heart rate and make you sweat (moderate-intensity exercise).  Do strengthening exercises at least twice a week. This is in addition to the moderate-intensity exercise.  Spend less time sitting. Even light physical activity can be beneficial. Watch cholesterol and blood lipids Have your blood tested for lipids and cholesterol at 52 years of age, then have this test every 5 years. Have your cholesterol levels checked more often if:  Your lipid or cholesterol levels are high.  You are older than 52  years of age.  You are at high risk for heart disease. What should I know about cancer screening? Depending on your health history and family history, you may need to have cancer screening at various ages. This may include screening for:  Breast cancer.  Cervical cancer.  Colorectal cancer.  Skin cancer.  Lung cancer. What should I know about heart disease, diabetes, and high blood pressure? Blood pressure and heart disease  High blood pressure causes heart disease and increases the risk of stroke. This is more likely to develop in people who have high blood pressure readings, are of African descent, or are overweight.  Have your blood pressure checked: ? Every 3-5 years if you are 18-39 years of age. ? Every year if you are 40 years old or older. Diabetes Have regular diabetes screenings. This checks your fasting blood sugar level. Have the screening done:  Once every three years after age 40 if you are at a normal weight and have a low risk for diabetes.  More often and at a younger age if you are overweight or have a high risk for diabetes. What should I know about preventing infection? Hepatitis B If you have a higher risk for hepatitis B, you should be screened for this virus. Talk with your health care provider to find out if you are at risk for hepatitis B infection. Hepatitis C Testing is recommended for:  Everyone born from 1945 through 1965.  Anyone with known risk factors for hepatitis C. Sexually   transmitted infections (STIs)  Get screened for STIs, including gonorrhea and chlamydia, if: ? You are sexually active and are younger than 52 years of age. ? You are older than 52 years of age and your health care provider tells you that you are at risk for this type of infection. ? Your sexual activity has changed since you were last screened, and you are at increased risk for chlamydia or gonorrhea. Ask your health care provider if you are at risk.  Ask your health  care provider about whether you are at high risk for HIV. Your health care provider may recommend a prescription medicine to help prevent HIV infection. If you choose to take medicine to prevent HIV, you should first get tested for HIV. You should then be tested every 3 months for as long as you are taking the medicine. Pregnancy  If you are about to stop having your period (premenopausal) and you may become pregnant, seek counseling before you get pregnant.  Take 400 to 800 micrograms (mcg) of folic acid every day if you become pregnant.  Ask for birth control (contraception) if you want to prevent pregnancy. Osteoporosis and menopause Osteoporosis is a disease in which the bones lose minerals and strength with aging. This can result in bone fractures. If you are 65 years old or older, or if you are at risk for osteoporosis and fractures, ask your health care provider if you should:  Be screened for bone loss.  Take a calcium or vitamin D supplement to lower your risk of fractures.  Be given hormone replacement therapy (HRT) to treat symptoms of menopause. Follow these instructions at home: Lifestyle  Do not use any products that contain nicotine or tobacco, such as cigarettes, e-cigarettes, and chewing tobacco. If you need help quitting, ask your health care provider.  Do not use street drugs.  Do not share needles.  Ask your health care provider for help if you need support or information about quitting drugs. Alcohol use  Do not drink alcohol if: ? Your health care provider tells you not to drink. ? You are pregnant, may be pregnant, or are planning to become pregnant.  If you drink alcohol: ? Limit how much you use to 0-1 drink a day. ? Limit intake if you are breastfeeding.  Be aware of how much alcohol is in your drink. In the [place] one drink equals one 12 oz bottle of beer (355 mL), one 5 oz glass of wine (148 mL), or one 1 oz glass of hard liquor (44 mL). General  instructions  Schedule regular health, dental, and eye exams.  Stay current with your vaccines.  Tell your health care provider if: ? You often feel depressed. ? You have ever been abused or do not feel safe at home. Summary  Adopting a healthy lifestyle and getting preventive care are important in promoting health and wellness.  Follow your health care provider's instructions about healthy diet, exercising, and getting tested or screened for diseases.  Follow your health care provider's instructions on monitoring your cholesterol and blood pressure. This information is not intended to replace advice given to you by your health care provider. Make sure you discuss any questions you have with your health care provider. Document Released: 05/05/2011 Document Revised: 10/13/2018 Document Reviewed: 10/13/2018 Elsevier Patient Education  2020 Elsevier Inc.  

## 2019-09-05 ENCOUNTER — Other Ambulatory Visit: Payer: Self-pay

## 2019-09-05 ENCOUNTER — Ambulatory Visit
Admission: RE | Admit: 2019-09-05 | Discharge: 2019-09-05 | Disposition: A | Discharge status: Home / Self Care | Payer: 59 | Source: Ambulatory Visit | Attending: Gynecology | Admitting: Gynecology

## 2019-09-05 DIAGNOSIS — Z1231 Encounter for screening mammogram for malignant neoplasm of breast: Secondary | ICD-10-CM

## 2020-02-08 ENCOUNTER — Encounter: Payer: Self-pay | Admitting: Physician Assistant

## 2020-02-08 ENCOUNTER — Ambulatory Visit (INDEPENDENT_AMBULATORY_CARE_PROVIDER_SITE_OTHER): Payer: 59

## 2020-02-08 ENCOUNTER — Other Ambulatory Visit: Payer: Self-pay

## 2020-02-08 ENCOUNTER — Ambulatory Visit (INDEPENDENT_AMBULATORY_CARE_PROVIDER_SITE_OTHER): Payer: 59 | Admitting: Physician Assistant

## 2020-02-08 VITALS — BP 152/90 | HR 84 | Temp 98.5°F | Ht 69.0 in | Wt 237.4 lb

## 2020-02-08 DIAGNOSIS — G8929 Other chronic pain: Secondary | ICD-10-CM

## 2020-02-08 DIAGNOSIS — M25561 Pain in right knee: Secondary | ICD-10-CM

## 2020-02-08 DIAGNOSIS — M79672 Pain in left foot: Secondary | ICD-10-CM

## 2020-02-08 NOTE — Patient Instructions (Signed)
It was great to see you!  I will be in touch with your xray.  A referral has been placed for you to see Dr. Lynne Leader or Dr. Charlann Boxer with [facility]. Someone from there office will be in touch soon regarding your appointment with him. His location: [place] at [facility] [address] on the 1st floor.   Phone number [phone], Fax [phone].  This location is across the street from the entrance to [employer] and in the same complex as the [facility] and [employer] bank    Take care,  Inda Coke PA-C

## 2020-02-08 NOTE — Progress Notes (Signed)
Debra Silva is a 54 y.o. female here for a new problem.   I acted as a Education administrator for [employer], PA-C [employer], [place]  History of Present Illness:   Chief Complaint  Patient presents with  . Right knee pain  . Left foot pain    HPI   R knee pain Chronic issues. Over the past two weeks has had worsening issues. Has pain and swelling. Works at a job where she stands all day so is unable to elevate to see if this can help her symptoms. She has take ibuprofen twice. Does have to wear a knee brace at work daily so her knee doesn't feel like its going to give out.  Has had R knee xray in 2011: IMPRESSION:   1.  Small round chronic calcification at the medial tibial tubercle consistent with cruciate ligament calcification.  Possible medial chondrocalcinosis. 2.  Slight patellofemoral degenerative joint disease. 3.  No acute findings.   L foot pain New onset left foot pain. Has had sudden onset swelling and pain to the lateral side of her foot. She is able to bear weight. No ankle pain, inciting injury, numbness/tingling.    Past Medical History:  Diagnosis Date  . Arthritis   . Migraine    hx of, resolved     Social History   Socioeconomic History  . Marital status: Single    Spouse name: Not on file  . Number of children: 1  . Years of education: Not on file  . Highest education level: Not on file  Occupational History  . Occupation: [employer]  Tobacco Use  . Smoking status: Never Smoker  . Smokeless tobacco: Never Used  Substance and Sexual Activity  . Alcohol use: No  . Drug use: No  . Sexual activity: Not on file  Other Topics Concern  . Not on file  Social History Narrative  . Not on file   Social Determinants of Health   Financial Resource Strain:   . Difficulty of Paying Living Expenses:   Food Insecurity:   . Worried About Charity fundraiser in the Last Year:   . Arboriculturist in the Last Year:   Transportation Needs:   . Lexicographer (Medical):   Marland Kitchen Lack of Transportation (Non-Medical):   Physical Activity:   . Days of Exercise per Week:   . Minutes of Exercise per Session:   Stress:   . Feeling of Stress :   Social Connections:   . Frequency of Communication with Friends and Family:   . Frequency of Social Gatherings with Friends and Family:   . Attends Religious Services:   . Active Member of Clubs or Organizations:   . Attends Archivist Meetings:   Marland Kitchen Marital Status:   Intimate Partner Violence:   . Fear of Current or Ex-Partner:   . Emotionally Abused:   Marland Kitchen Physically Abused:   . Sexually Abused:     Past Surgical History:  Procedure Laterality Date  . COLONOSCOPY    . PARTIAL HYSTERECTOMY  2003    Family History  Problem Relation Age of Onset  . Breast cancer Mother 67  . Kidney disease Father   . Stroke Father   . Arthritis Father   . Diabetes Maternal Grandmother        on insulin  . ALS Maternal Grandmother 83  . Hypertension Brother   . Colon polyps Son   . Colon cancer Neg Hx   .  Esophageal cancer Neg Hx   . Rectal cancer Neg Hx   . Stomach cancer Neg Hx     No Known Allergies  Current Medications:   Current Outpatient Medications:  .  Cholecalciferol (VITAMIN D) 2000 units tablet, Take 2,000 Units by mouth daily., Disp: , Rfl:  .  estradiol (CLIMARA - DOSED IN MG/24 HR) 0.025 mg/24hr patch, Place 0.025 mg onto the skin once a week., Disp: , Rfl:    Review of Systems:   ROS  Negative unless otherwise specified per HPI.  Vitals:   Vitals:   02/08/20 1408 02/08/20 1429  BP: (!) 158/92 (!) 152/90  Pulse: 84   Temp: 98.5 F (36.9 C)   TempSrc: Temporal   SpO2: 94%   Weight: 237 lb 6.4 oz (107.7 kg)   Height: 5\' 9"  (1.753 m)      Body mass index is 35.06 kg/m.  Physical Exam:   Physical Exam Vitals and nursing note reviewed.  Constitutional:      General: She is not in acute distress.    Appearance: She is well-developed. She is not  ill-appearing or toxic-appearing.  HENT:     Head: Normocephalic and atraumatic.  Eyes:     Conjunctiva/sclera: Conjunctivae normal.  Cardiovascular:     Rate and Rhythm: Normal rate and regular rhythm.     Pulses: Normal pulses.     Heart sounds: Normal heart sounds, S1 normal and S2 normal.     Comments: No LE edema Pulmonary:     Effort: Pulmonary effort is normal.     Breath sounds: Normal breath sounds.  Musculoskeletal:        General: Normal range of motion.     Cervical back: Normal range of motion and neck supple.     Comments: Significant swelling and mild TTP to lateral and medial R knee. No decreased ROM.  Swelling to lateral left foot. No ankle swelling or tenderness. No decreased ROM.  Skin:    General: Skin is warm and dry.  Neurological:     Mental Status: She is alert and oriented to person, place, and time.     GCS: GCS eye subscore is 4. GCS verbal subscore is 5. GCS motor subscore is 6.  Psychiatric:        Speech: Speech normal.        Behavior: Behavior normal. Behavior is cooperative.        Thought Content: Thought content normal.        Judgment: Judgment normal.        Assessment and Plan:   Ayania was seen today for right knee pain and left foot pain.  Diagnoses and all orders for this visit:  Left foot pain Patient agreeable to xray. Will r/o stress fracture or other bony abnormality. She denies need for any medication at this time. Also referral to sports medicine for further evaluation. -     DG Foot Complete Left; Future -     Ambulatory referral to Sports Medicine  Chronic pain of right knee Long standing issues. She denies need for any medication at this time. Referral to sports medicine for further evaluation. Will defer imaging to sports medicine. -     Ambulatory referral to Sports Medicine  . Reviewed expectations re: course of current medical issues. . Discussed self-management of symptoms. . Outlined signs and symptoms indicating  need for more acute intervention. . Patient verbalized understanding and all questions were answered. . See orders for this visit as  documented in the electronic medical record. . Patient received an After-Visit Summary.  CMA or LPN served as scribe during this visit. History, Physical, and Plan performed by medical provider. The above documentation has been reviewed and is accurate and complete.  Inda Coke, PA-C

## 2020-02-13 ENCOUNTER — Ambulatory Visit (INDEPENDENT_AMBULATORY_CARE_PROVIDER_SITE_OTHER): Payer: 59

## 2020-02-13 ENCOUNTER — Ambulatory Visit: Payer: 59 | Admitting: Family Medicine

## 2020-02-13 ENCOUNTER — Other Ambulatory Visit: Payer: Self-pay

## 2020-02-13 ENCOUNTER — Encounter: Payer: Self-pay | Admitting: Family Medicine

## 2020-02-13 ENCOUNTER — Ambulatory Visit: Payer: Self-pay

## 2020-02-13 VITALS — BP 120/82 | HR 99 | Ht 69.0 in | Wt 235.2 lb

## 2020-02-13 DIAGNOSIS — G8929 Other chronic pain: Secondary | ICD-10-CM

## 2020-02-13 DIAGNOSIS — M25561 Pain in right knee: Secondary | ICD-10-CM | POA: Diagnosis not present

## 2020-02-13 DIAGNOSIS — M25461 Effusion, right knee: Secondary | ICD-10-CM | POA: Diagnosis not present

## 2020-02-13 DIAGNOSIS — M25472 Effusion, left ankle: Secondary | ICD-10-CM

## 2020-02-13 LAB — COMPREHENSIVE METABOLIC PANEL
ALT: 13 U/L (ref 0–35)
AST: 14 U/L (ref 0–37)
Albumin: 4.2 g/dL (ref 3.5–5.2)
Alkaline Phosphatase: 66 U/L (ref 39–117)
BUN: 8 mg/dL (ref 6–23)
CO2: 26 mEq/L (ref 19–32)
Calcium: 9.7 mg/dL (ref 8.4–10.5)
Chloride: 105 mEq/L (ref 96–112)
Creatinine, Ser: 1.05 mg/dL (ref 0.40–1.20)
GFR: 66.47 mL/min (ref >60.00)
Glucose, Bld: 109 mg/dL — ABNORMAL HIGH (ref 70–99)
Potassium: 3.7 mEq/L (ref 3.5–5.1)
Sodium: 138 mEq/L (ref 135–145)
Total Bilirubin: 0.3 mg/dL (ref 0.2–1.2)
Total Protein: 7.1 g/dL (ref 6.0–8.3)

## 2020-02-13 LAB — CBC WITH DIFFERENTIAL/PLATELET
Basophils Absolute: 0 10*3/uL (ref 0.0–0.1)
Basophils Relative: 0.7 % (ref 0.0–3.0)
Eosinophils Absolute: 0.1 10*3/uL (ref 0.0–0.7)
Eosinophils Relative: 1 % (ref 0.0–5.0)
HCT: 45.1 % (ref 36.0–46.0)
Hemoglobin: 15.1 g/dL — ABNORMAL HIGH (ref 12.0–15.0)
Lymphocytes Relative: 36.6 % (ref 12.0–46.0)
Lymphs Abs: 2.6 10*3/uL (ref 0.7–4.0)
MCHC: 33.4 g/dL (ref 30.0–36.0)
MCV: 79.7 fl (ref 78.0–100.0)
Monocytes Absolute: 0.4 10*3/uL (ref 0.1–1.0)
Monocytes Relative: 5.9 % (ref 3.0–12.0)
Neutro Abs: 3.9 10*3/uL (ref 1.4–7.7)
Neutrophils Relative %: 55.8 % (ref 43.0–77.0)
Platelets: 261 10*3/uL (ref 150.0–400.0)
RBC: 5.66 Mil/uL — ABNORMAL HIGH (ref 3.87–5.11)
RDW: 14 % (ref 11.5–15.5)
WBC: 7 10*3/uL (ref 4.0–10.5)

## 2020-02-13 LAB — SEDIMENTATION RATE: Sed Rate: 18 mm/hr (ref 0–30)

## 2020-02-13 LAB — URIC ACID: Uric Acid, Serum: 5.5 mg/dL (ref 2.4–7.0)

## 2020-02-13 NOTE — Patient Instructions (Signed)
Thank you for coming in today. Get xray and labs today.  Try using over the counter voltaren get on the knee and ankle.   I am looking for gout and rheumatoid arthritis and lupus.   I will get results to you soon.

## 2020-02-13 NOTE — Progress Notes (Signed)
Subjective:    I'm seeing this patient as a consultation for:  Len Blalock, [place]. Note will be routed back to referring provider/PCP.  CC: L foot and R knee pain  I, Molly Weber, LAT, ATC, am serving as scribe for Dr. Lynne Leader.  HPI: Pt is a 54 y/o female presenting w/ c/o L foot pain and R knee pain.  R knee pain: Chronic knee pain worsening over the last 2 weeks w/ no known MOI. -Knee swelling: Yes -Aggravating factors: prolonged standing; taking first few steps after prolonged sitting; stairs -Treatments tried: IBU; knee brace; Epsom salt soaks  L foot pain: Sudden onset pain and swelling to her L lateral foot x 2 weeks -Foot swelling: yes -Aggravating factors: Prolonged weight bearing  -Treatments tried: Nothing  Diagnostic testing: L foot XR- 02/08/20  Past medical history, Surgical history, Family history, Social history, Allergies, and medications have been entered into the medical record, reviewed.  Positive family history for rheumatoid arthritis in her father.  No personal history of rheumatologic disorder or gout.  Her brother has gout.   Review of Systems: No new headache, visual changes, nausea, vomiting, diarrhea, constipation, dizziness, abdominal pain, skin rash, fevers, chills, night sweats, weight loss, swollen lymph nodes, body aches, joint swelling, muscle aches, chest pain, shortness of breath, mood changes, visual or auditory hallucinations.   Objective:    Vitals:   02/13/20 1115  BP: 120/82  Pulse: 99  SpO2: 96%   General: Well Developed, well nourished, and in no acute distress.  Neuro/Psych: Alert and oriented x3, extra-ocular muscles intact, able to move all 4 extremities, sensation grossly intact. Skin: Warm and dry, no rashes noted.  Respiratory: Not using accessory muscles, speaking in full sentences, trachea midline.  Cardiovascular: Pulses palpable, no extremity edema. Abdomen: Does not appear distended. MSK:  Right knee: Mild effusion  otherwise normal-appearing Range of motion 0-120 degrees with crepitation. Mildly tender palpation lateral and medial joint line. Stable ligamentous exam. Negative Murray's test. Intact flexion extension strength.  Left knee: Minimal effusion normal-appearing otherwise Range of motion 0 to 120 degrees crepitation. Not particularly tender. Stable given his exam. Negative Murray's test. Intact flexion extension strength.  Left foot and ankle normal-appearing Not particularly tender to palpation. Normal foot and ankle motion. Stable ligamentous exam. Intact strength. Some pain with resisted foot eversion. Pulses capillary fill and sensation are intact distally.   Lab and Radiology Results  DG Foot Complete Left  Result Date: 02/08/2020 CLINICAL DATA:  Sudden onset of lateral foot pain and swelling. EXAM: LEFT FOOT - COMPLETE 3+ VIEW COMPARISON:  None. FINDINGS: There is no evidence of fracture or dislocation. Mild to moderate severity degenerative changes seen involving the metatarsophalangeal joint of the left great toe. Soft tissues are unremarkable. IMPRESSION: Mild to moderate severity degenerative changes involving the metatarsophalangeal joint of the left great toe. Electronically Signed   By: Virgina Norfolk M.D.   On: 02/08/2020 22:39   I, Lynne Leader, personally (independently) visualized and performed the interpretation of the images attached in this note.  X-ray images right knee obtained today personally and independently reviewed Mild DJD no acute fractures.  Possible mild chondrocalcinosis Await formal radiology review  Diagnostic Limited MSK Ultrasound of: Right knee Tendon intact normal-appearing Moderate joint effusion present. Patellar tendon normal-appearing Medial joint line narrowed degenerative appearing meniscus. Lateral joint line appropriate spacing however lateral meniscus has calcifications present at margin consistent with old healed lateral meniscus  injury. No Baker's cyst normal bony structures  otherwise Impression: DJD.  Evidence of old medial lateral meniscus injury moderate effusion.  Diagnostic Limited MSK Ultrasound of: Left ankle and foot Achilles tendon normal-appearing Peroneal tendons normal appearing Lateral joint line mild effusion. No cortical defects along tarsal or metatarsal bones at the lateral foot Impression: Mild ankle effusion    Impression and Recommendations:    Assessment and Plan: 54 y.o. female with effusion and pain of right knee and left ankle spontaneously occurring a few weeks ago.  Pain is slowly improving.  Patient does have degenerative changes in both the left ankle and right knee however I am concerned about some systemic inflammatory process.  Most likely explanation is gout or pseudogout.  She does have a family history for rheumatoid arthritis and gout.  Discussed options with patient.  She like to avoid injection today which is reasonable.  Plan for rheumatologic work-up with labs listed below.  Additionally recommend trial of Voltaren gel.  Will discuss lab results with patient and plan for next steps they are back.  She notes that she would like to avoid medications if possible.  PDMP not reviewed this encounter. Orders Placed This Encounter  Procedures  . [place] LIMITED JOINT SPACE STRUCTURES LOW RIGHT(NO LINKED CHARGES)    Order Specific Question:   Reason for Exam (SYMPTOM  OR DIAGNOSIS REQUIRED)    Answer:   R knee pain    Order Specific Question:   Preferred imaging location?    Answer:   [place]  . DG Knee AP/LAT W/[place] Right    Standing Status:   Future    Number of Occurrences:   1    Standing Expiration Date:   04/14/2021    Order Specific Question:   Reason for Exam (SYMPTOM  OR DIAGNOSIS REQUIRED)    Answer:   eval right knee pain and swelling    Order Specific Question:   Is patient pregnant?    Answer:   No    Order Specific Question:   Preferred  imaging location?    Answer:   [facility]    Order Specific Question:   Radiology Contrast Protocol - do NOT remove file path    Answer:   [email or web address]  . Comprehensive metabolic panel    Standing Status:   Future    Number of Occurrences:   1    Standing Expiration Date:   02/12/2021  . Sedimentation rate    Standing Status:   Future    Number of Occurrences:   1    Standing Expiration Date:   02/12/2021  . Rheumatoid factor    Standing Status:   Future    Number of Occurrences:   1    Standing Expiration Date:   02/12/2021  . Cyclic citrul peptide antibody, IgG    Standing Status:   Future    Number of Occurrences:   1    Standing Expiration Date:   02/12/2021  . ANA    Standing Status:   Future    Number of Occurrences:   1    Standing Expiration Date:   02/12/2021  . Uric acid    Standing Status:   Future    Number of Occurrences:   1    Standing Expiration Date:   02/12/2021  . Complement, total    Standing Status:   Future    Number of Occurrences:   1    Standing Expiration Date:   02/12/2021  . CBC with Differential/Platelet  Standing Status:   Future    Number of Occurrences:   1    Standing Expiration Date:   05/14/2020   No orders of the defined types were placed in this encounter.   Discussed warning signs or symptoms. Please see discharge instructions. Patient expresses understanding.   The above documentation has been reviewed and is accurate and complete Lynne Leader

## 2020-02-14 NOTE — Progress Notes (Signed)
Labs so far look pretty normal.  Uric acid is normal.  More labs are pending.

## 2020-02-14 NOTE — Progress Notes (Signed)
Mild arthritis seen in the knee.

## 2020-02-16 LAB — COMPLEMENT, TOTAL: Compl, Total (CH50): >60 U/mL — ABNORMAL HIGH (ref 31–60)

## 2020-02-16 LAB — RHEUMATOID FACTOR: Rheumatoid fact SerPl-aCnc: <14 IU/mL (ref <14)

## 2020-02-16 LAB — CYCLIC CITRUL PEPTIDE ANTIBODY, IGG: Cyclic Citrullin Peptide Ab: <16 UNITS

## 2020-02-16 LAB — ANA: Anti Nuclear Antibody (ANA): NEGATIVE

## 2020-02-16 NOTE — Progress Notes (Signed)
Complement level is elevated but other tests that would indicate possible lupus are negative.  If not improved would consider further rheumatologic work-up

## 2020-06-22 ENCOUNTER — Other Ambulatory Visit: Payer: 59

## 2020-08-27 ENCOUNTER — Other Ambulatory Visit: Payer: Self-pay | Admitting: Gynecology

## 2020-08-27 DIAGNOSIS — Z1231 Encounter for screening mammogram for malignant neoplasm of breast: Secondary | ICD-10-CM

## 2020-09-07 ENCOUNTER — Encounter: Payer: Self-pay | Admitting: Physician Assistant

## 2020-09-07 ENCOUNTER — Ambulatory Visit (INDEPENDENT_AMBULATORY_CARE_PROVIDER_SITE_OTHER): Payer: 59 | Admitting: Physician Assistant

## 2020-09-07 ENCOUNTER — Other Ambulatory Visit: Payer: Self-pay

## 2020-09-07 VITALS — BP 142/88 | HR 85 | Temp 97.7°F | Ht 69.0 in | Wt 238.2 lb

## 2020-09-07 DIAGNOSIS — M79672 Pain in left foot: Secondary | ICD-10-CM | POA: Diagnosis not present

## 2020-09-07 DIAGNOSIS — Z0000 Encounter for general adult medical examination without abnormal findings: Secondary | ICD-10-CM

## 2020-09-07 DIAGNOSIS — E669 Obesity, unspecified: Secondary | ICD-10-CM

## 2020-09-07 NOTE — Progress Notes (Signed)
I acted as a Education administrator for [employer], PA-C Anselmo Pickler, LPN    Subjective:    Debra Silva is a 54 y.o. female and is here for a comprehensive physical exam.   HPI  Health Maintenance Due  Topic Date Due   Hepatitis C Screening  Never done   MAMMOGRAM  09/04/2020    Acute Concerns: None  Chronic Issues: L ankle and foot pain -- saw [place] back in April for this. Had sudden onset swelling and pain to lateral left side of foot with difficulty bearing weight. She saw sports medicine a week after our visit and had a negative work-up with autoimmune labs. She is wanting another opinion as her symptoms persist.  Health Maintenance: Immunizations -- UTD, declines Flu Colonoscopy -- UTD Mammogram -- UTD, scheduled for Dec PAP -- N/A Bone Density -- N/A Diet -- overall stable Caffeine intake -- none Sleep habits -- gets up 1-2 hours to urinate, doesn't cut off fluids in late afternoon Exercise -- limited 2/2 weather and pain Weight -- Weight: 238 lb 3.2 oz (108 kg)  Mood -- stable Weight history: Wt Readings from Last 10 Encounters:  09/07/20 238 lb 3.2 oz (108 kg)  02/13/20 235 lb 3.2 oz (106.7 kg)  02/08/20 237 lb 6.4 oz (107.7 kg)  08/17/19 234 lb (106.1 kg)  08/09/18 226 lb (102.5 kg)  08/04/18 226 lb (102.5 kg)  07/30/18 227 lb (103 kg)  02/18/18 227 lb 9.6 oz (103.2 kg)  07/15/17 234 lb 12.8 oz (106.5 kg)  08/26/16 234 lb 1.6 oz (106.2 kg)   Body mass index is 35.18 kg/m. No LMP recorded. Patient has had a hysterectomy. Alcohol use: none Tobacco use: none  Depression screen PHQ 2/9 09/07/2020  Decreased Interest 0  Down, Depressed, Hopeless 0  PHQ - 2 Score 0     Other providers/specialists: Patient Care Team: Inda Coke, [place] as PCP - General (Physician Assistant) Pyrtle, Lajuan Lines, MD (Gastroenterology) Key, Nelia Shi, NP as Nurse Practitioner (Gynecology)    PMHx, SurgHx, SocialHx, Medications, and Allergies were reviewed in the Visit  Navigator and updated as appropriate.   Past Medical History:  Diagnosis Date   Arthritis    Migraine    hx of, resolved     Past Surgical History:  Procedure Laterality Date   COLONOSCOPY     PARTIAL HYSTERECTOMY  2003     Family History  Problem Relation Age of Onset   Breast cancer Mother 34   Kidney disease Father    Stroke Father    Arthritis Father    Diabetes Maternal Grandmother        on insulin   ALS Maternal Grandmother 83   Hypertension Brother    Colon polyps Son    Colon cancer Neg Hx    Esophageal cancer Neg Hx    Rectal cancer Neg Hx    Stomach cancer Neg Hx     Social History   Tobacco Use   Smoking status: Never Smoker   Smokeless tobacco: Never Used  Vaping Use   Vaping Use: Never used  Substance Use Topics   Alcohol use: No   Drug use: No    Review of Systems:   Review of Systems  Constitutional: Negative for chills, fever, malaise/fatigue and weight loss.  HENT: Negative for hearing loss, sinus pain and sore throat.   Respiratory: Negative for cough and hemoptysis.   Cardiovascular: Negative for chest pain, palpitations, leg swelling and PND.  Gastrointestinal:  Negative for abdominal pain, constipation, diarrhea, heartburn, nausea and vomiting.  Genitourinary: Negative for dysuria, frequency and urgency.  Musculoskeletal: Positive for joint pain. Negative for back pain, myalgias and neck pain.  Skin: Negative for itching and rash.  Neurological: Negative for dizziness, tingling, seizures and headaches.  Endo/Heme/Allergies: Negative for polydipsia.  Psychiatric/Behavioral: Negative for depression. The patient is not nervous/anxious.     Objective:   BP (!) 142/88    Pulse 85    Temp 97.7 F (36.5 C) (Temporal)    Ht 5\' 9"  (1.753 m)    Wt 238 lb 3.2 oz (108 kg)    SpO2 97%    BMI 35.18 kg/m  Body mass index is 35.18 kg/m.   General Appearance:    Alert, cooperative, no distress, appears stated age  Head:     Normocephalic, without obvious abnormality, atraumatic  Eyes:    PERRL, conjunctiva/corneas clear, EOM's intact, fundi    benign, both eyes  Ears:    Normal TM's and external ear canals, both ears  Nose:   Nares normal, septum midline, mucosa normal, no drainage    or sinus tenderness  Throat:   Lips, mucosa, and tongue normal; teeth and gums normal  Neck:   Supple, symmetrical, trachea midline, no adenopathy;    thyroid:  no enlargement/tenderness/nodules; no carotid   bruit or JVD  Back:     Symmetric, no curvature, ROM normal, no CVA tenderness  Lungs:     Clear to auscultation bilaterally, respirations unlabored  Chest Wall:    No tenderness or deformity   Heart:    Regular rate and rhythm, S1 and S2 normal, no murmur, rub or gallop  Breast Exam:    Deferred  Abdomen:     Soft, non-tender, bowel sounds active all four quadrants,    no masses, no organomegaly  Genitalia:    Deferred  Extremities:   Extremities normal, atraumatic, no cyanosis or edema  Pulses:   2+ and symmetric all extremities  Skin:   Skin color, texture, turgor normal, no rashes or lesions  Lymph nodes:   Cervical, supraclavicular, and axillary nodes normal  Neurologic:   CNII-XII intact, normal strength, sensation and reflexes    throughout    Assessment/Plan:   [name] was seen today for annual exam and foot pain.  Diagnoses and all orders for this visit:  Routine physical examination Today patient counseled on age appropriate routine health concerns for screening and prevention, each reviewed and up to date or declined. Immunizations reviewed and up to date or declined. Labs ordered and reviewed. Risk factors for depression reviewed and negative. Hearing function and visual acuity are intact. ADLs screened and addressed as needed. Functional ability and level of safety reviewed and appropriate. Education, counseling and referrals performed based on assessed risks today. Patient provided with a copy of  personalized plan for preventive services.  Left foot pain Symptoms persist. She is requesting referral to Podiatry, referral placed today. -     Ambulatory referral to Podiatry  Obesity (BMI 30-39.9) Recommend better portions. Resumption of activity as foot allows. -     CBC with Differential/Platelet; Future -     Comprehensive metabolic panel; Future -     Lipid panel; Future -     Lipid panel -     Comprehensive metabolic panel -     CBC with Differential/Platelet     Well Adult Exam: Labs ordered: Yes. Patient counseling was done. See below for items discussed. Discussed the  patient's BMI.  The BMI is not in the acceptable range; BMI management plan is completed Follow up in one year. Breast cancer screening: scheduled. Cervical cancer screening: n/a   Patient Counseling: [x]    Nutrition: Stressed importance of moderation in sodium/caffeine intake, saturated fat and cholesterol, caloric balance, sufficient intake of fresh fruits, vegetables, fiber, calcium, iron, and 1 mg of folate supplement per day (for females capable of pregnancy).  [x]    Stressed the importance of regular exercise.   [x]    Substance Abuse: Discussed cessation/primary prevention of tobacco, alcohol, or other drug use; driving or other dangerous activities under the influence; availability of treatment for abuse.   [x]    Injury prevention: Discussed safety belts, safety helmets, smoke detector, smoking near bedding or upholstery.   [x]    Sexuality: Discussed sexually transmitted diseases, partner selection, use of condoms, avoidance of unintended pregnancy  and contraceptive alternatives.  [x]    Dental health: Discussed importance of regular tooth brushing, flossing, and dental visits.  [x]    Health maintenance and immunizations reviewed. Please refer to Health maintenance section.   CMA or LPN served as scribe during this visit. History, Physical, and Plan performed by medical provider. The above documentation  has been reviewed and is accurate and complete.   Inda Coke, PA-C [place]

## 2020-09-07 NOTE — Patient Instructions (Addendum)
It was great to see you!  Try to reduce your fluid intake in late afternoon/evening to see if this makes a difference.  Please go to the lab for blood work.   Our office will call you with your results unless you have chosen to receive results via MyChart.  If your blood work is normal we will follow-up each year for physicals and as scheduled for chronic medical problems.  If anything is abnormal we will treat accordingly and get you in for a follow-up.  Take care,  [facility] Maintenance, Female Adopting a healthy lifestyle and getting preventive care are important in promoting health and wellness. Ask your health care provider about:  The right schedule for you to have regular tests and exams.  Things you can do on your own to prevent diseases and keep yourself healthy. What should I know about diet, weight, and exercise? Eat a healthy diet   Eat a diet that includes plenty of vegetables, fruits, low-fat dairy products, and lean protein.  Do not eat a lot of foods that are high in solid fats, added sugars, or sodium. Maintain a healthy weight Body mass index (BMI) is used to identify weight problems. It estimates body fat based on height and weight. Your health care provider can help determine your BMI and help you achieve or maintain a healthy weight. Get regular exercise Get regular exercise. This is one of the most important things you can do for your health. Most adults should:  Exercise for at least 150 minutes each week. The exercise should increase your heart rate and make you sweat (moderate-intensity exercise).  Do strengthening exercises at least twice a week. This is in addition to the moderate-intensity exercise.  Spend less time sitting. Even light physical activity can be beneficial. Watch cholesterol and blood lipids Have your blood tested for lipids and cholesterol at 53 years of age, then have this test every 5 years. Have your cholesterol levels  checked more often if:  Your lipid or cholesterol levels are high.  You are older than 53 years of age.  You are at high risk for heart disease. What should I know about cancer screening? Depending on your health history and family history, you may need to have cancer screening at various ages. This may include screening for:  Breast cancer.  Cervical cancer.  Colorectal cancer.  Skin cancer.  Lung cancer. What should I know about heart disease, diabetes, and high blood pressure? Blood pressure and heart disease  High blood pressure causes heart disease and increases the risk of stroke. This is more likely to develop in people who have high blood pressure readings, are of African descent, or are overweight.  Have your blood pressure checked: ? Every 3-5 years if you are 53-90 years of age. ? Every year if you are 75 years old or older. Diabetes Have regular diabetes screenings. This checks your fasting blood sugar level. Have the screening done:  Once every three years after age 31 if you are at a normal weight and have a low risk for diabetes.  More often and at a younger age if you are overweight or have a high risk for diabetes. What should I know about preventing infection? Hepatitis B If you have a higher risk for hepatitis B, you should be screened for this virus. Talk with your health care provider to find out if you are at risk for hepatitis B infection. Hepatitis C Testing is recommended for:  Everyone born from [birth year] through 1964.  Anyone with known risk factors for hepatitis C. Sexually transmitted infections (STIs)  Get screened for STIs, including gonorrhea and chlamydia, if: ? You are sexually active and are younger than 53 years of age. ? You are older than 53 years of age and your health care provider tells you that you are at risk for this type of infection. ? Your sexual activity has changed since you were last screened, and you are at increased risk  for chlamydia or gonorrhea. Ask your health care provider if you are at risk.  Ask your health care provider about whether you are at high risk for HIV. Your health care provider may recommend a prescription medicine to help prevent HIV infection. If you choose to take medicine to prevent HIV, you should first get tested for HIV. You should then be tested every 3 months for as long as you are taking the medicine. Pregnancy  If you are about to stop having your period (premenopausal) and you may become pregnant, seek counseling before you get pregnant.  Take 400 to 800 micrograms (mcg) of folic acid every day if you become pregnant.  Ask for birth control (contraception) if you want to prevent pregnancy. Osteoporosis and menopause Osteoporosis is a disease in which the bones lose minerals and strength with aging. This can result in bone fractures. If you are 58 years old or older, or if you are at risk for osteoporosis and fractures, ask your health care provider if you should:  Be screened for bone loss.  Take a calcium or vitamin D supplement to lower your risk of fractures.  Be given hormone replacement therapy (HRT) to treat symptoms of menopause. Follow these instructions at home: Lifestyle  Do not use any products that contain nicotine or tobacco, such as cigarettes, e-cigarettes, and chewing tobacco. If you need help quitting, ask your health care provider.  Do not use street drugs.  Do not share needles.  Ask your health care provider for help if you need support or information about quitting drugs. Alcohol use  Do not drink alcohol if: ? Your health care provider tells you not to drink. ? You are pregnant, may be pregnant, or are planning to become pregnant.  If you drink alcohol: ? Limit how much you use to 0-1 drink a day. ? Limit intake if you are breastfeeding.  Be aware of how much alcohol is in your drink. In the [place] one drink equals one 12 oz bottle of beer (355  mL), one 5 oz glass of wine (148 mL), or one 1 oz glass of hard liquor (44 mL). General instructions  Schedule regular health, dental, and eye exams.  Stay current with your vaccines.  Tell your health care provider if: ? You often feel depressed. ? You have ever been abused or do not feel safe at home. Summary  Adopting a healthy lifestyle and getting preventive care are important in promoting health and wellness.  Follow your health care provider's instructions about healthy diet, exercising, and getting tested or screened for diseases.  Follow your health care provider's instructions on monitoring your cholesterol and blood pressure. This information is not intended to replace advice given to you by your health care provider. Make sure you discuss any questions you have with your health care provider. Document Revised: 10/13/2018 Document Reviewed: 10/13/2018 Elsevier Patient Education  2020 [employer].

## 2020-09-08 LAB — CBC WITH DIFFERENTIAL/PLATELET
Absolute Monocytes: 506 cells/uL (ref 200–950)
Basophils Absolute: 42 cells/uL (ref 0–200)
Basophils Relative: 0.5 %
Eosinophils Absolute: 100 cells/uL (ref 15–500)
Eosinophils Relative: 1.2 %
HCT: 43.5 % (ref 35.0–45.0)
Hemoglobin: 14.2 g/dL (ref 11.7–15.5)
Lymphs Abs: 3486 cells/uL (ref 850–3900)
MCH: 26.3 pg — ABNORMAL LOW (ref 27.0–33.0)
MCHC: 32.6 g/dL (ref 32.0–36.0)
MCV: 80.6 fL (ref 80.0–100.0)
MPV: 11 fL (ref 7.5–12.5)
Monocytes Relative: 6.1 %
Neutro Abs: 4167 cells/uL (ref 1500–7800)
Neutrophils Relative %: 50.2 %
Platelets: 276 10*3/uL (ref 140–400)
RBC: 5.4 10*6/uL — ABNORMAL HIGH (ref 3.80–5.10)
RDW: 13 % (ref 11.0–15.0)
Total Lymphocyte: 42 %
WBC: 8.3 10*3/uL (ref 3.8–10.8)

## 2020-09-08 LAB — LIPID PANEL
Cholesterol: 147 mg/dL (ref <200)
HDL: 66 mg/dL (ref >=50)
LDL Cholesterol (Calc): 60 mg/dL (calc)
Non-HDL Cholesterol (Calc): 81 mg/dL (calc) (ref <130)
Total CHOL/HDL Ratio: 2.2 (calc) (ref <5.0)
Triglycerides: 120 mg/dL (ref <150)

## 2020-09-08 LAB — COMPREHENSIVE METABOLIC PANEL
AG Ratio: 1.7 (calc) (ref 1.0–2.5)
ALT: 11 U/L (ref 6–29)
AST: 14 U/L (ref 10–35)
Albumin: 4.3 g/dL (ref 3.6–5.1)
Alkaline phosphatase (APISO): 67 U/L (ref 37–153)
BUN: 10 mg/dL (ref 7–25)
CO2: 28 mmol/L (ref 20–32)
Calcium: 10 mg/dL (ref 8.6–10.4)
Chloride: 104 mmol/L (ref 98–110)
Creat: 1.05 mg/dL (ref 0.50–1.05)
Globulin: 2.5 g/dL (calc) (ref 1.9–3.7)
Glucose, Bld: 89 mg/dL (ref 65–99)
Potassium: 4.2 mmol/L (ref 3.5–5.3)
Sodium: 140 mmol/L (ref 135–146)
Total Bilirubin: 0.3 mg/dL (ref 0.2–1.2)
Total Protein: 6.8 g/dL (ref 6.1–8.1)

## 2020-09-30 ENCOUNTER — Other Ambulatory Visit: Payer: Self-pay

## 2020-09-30 ENCOUNTER — Ambulatory Visit (HOSPITAL_COMMUNITY)
Admission: EM | Admit: 2020-09-30 | Discharge: 2020-09-30 | Disposition: A | Discharge status: Home / Self Care | Payer: 59 | Attending: Family Medicine | Admitting: Family Medicine

## 2020-09-30 ENCOUNTER — Encounter (HOSPITAL_COMMUNITY): Payer: Self-pay

## 2020-09-30 DIAGNOSIS — M5412 Radiculopathy, cervical region: Secondary | ICD-10-CM | POA: Diagnosis not present

## 2020-09-30 DIAGNOSIS — R202 Paresthesia of skin: Secondary | ICD-10-CM

## 2020-09-30 DIAGNOSIS — M79602 Pain in left arm: Secondary | ICD-10-CM | POA: Diagnosis not present

## 2020-09-30 DIAGNOSIS — S161XXA Strain of muscle, fascia and tendon at neck level, initial encounter: Secondary | ICD-10-CM

## 2020-09-30 MED ORDER — KETOROLAC TROMETHAMINE 30 MG/ML IJ SOLN
30.0000 mg | Freq: Once | INTRAMUSCULAR | Status: AC
  Administered 2020-09-30: 30 mg via INTRAMUSCULAR

## 2020-09-30 MED ORDER — DEXAMETHASONE SODIUM PHOSPHATE 10 MG/ML IJ SOLN
10.0000 mg | Freq: Once | INTRAMUSCULAR | Status: AC
  Administered 2020-09-30: 10 mg via INTRAMUSCULAR

## 2020-09-30 MED ORDER — CYCLOBENZAPRINE HCL 5 MG PO TABS: 5.0000 mg | ORAL_TABLET | Freq: Three times a day (TID) | ORAL | 0 refills | Status: DC | PRN

## 2020-09-30 MED ORDER — IBUPROFEN 800 MG PO TABS: 800.0000 mg | ORAL_TABLET | Freq: Three times a day (TID) | ORAL | 0 refills | Status: DC | PRN

## 2020-09-30 MED ORDER — PREDNISONE 20 MG PO TABS: 20.0000 mg | ORAL_TABLET | Freq: Every day | ORAL | 0 refills | Status: AC

## 2020-09-30 MED ORDER — DEXAMETHASONE SODIUM PHOSPHATE 10 MG/ML IJ SOLN
INTRAMUSCULAR | Status: AC
  Filled 2020-09-30: qty 1

## 2020-09-30 MED ORDER — KETOROLAC TROMETHAMINE 30 MG/ML IJ SOLN
INTRAMUSCULAR | Status: AC
  Filled 2020-09-30: qty 1

## 2020-09-30 NOTE — Discharge Instructions (Addendum)
You have received an injection of Toradol and of Decadron in the office today.  These are for pain and inflammation.  Have sent in ibuprofen 800 mg for you to take 1 tablet 3 times a day as needed for pain  I have sent in Flexeril 5 mg tablets for you to take 3 times a day as needed for muscle spasms.  This medication can make you sleepy, do not drive or operate heavy machinery while taking this medication.  Follow up with this office or with primary care if symptoms are persisting.  Follow up in the ER for high fever, trouble swallowing, trouble breathing, other concerning symptoms.

## 2020-09-30 NOTE — ED Triage Notes (Signed)
Pt present left arm and shoulder pain. Symptom started Thursday. Pt states left arm has burning sensation going down toward her fingers and making her hands numb.

## 2020-09-30 NOTE — ED Provider Notes (Signed)
[place]   [id number] 09/30/20 Arrival Time: 1610  ZM:OQHUT PAIN  SUBJECTIVE: History from: patient. CELINDA DETHLEFS is a 54 y.o. female complains of left arm pain, numbness, tingling, burning sensation down the arm. Reports that her left shoulder and left neck is very tight and stiff and painful. Has taken Aleve muscle and back with no relief. Reports that she was in the car on the way back from the beach today. Has not had these issues in the past.  Reports that she sleeps on the left side as well and thinks that this may be contributing to her symptoms.  Symptoms are made worse with activity.  Denies similar symptoms in the past.  Denies fever, chills, erythema, ecchymosis, effusion, weakness, saddle paresthesias, loss of bowel or bladder function.      ROS: As per HPI.  All other pertinent ROS negative.     Past Medical History:  Diagnosis Date  . Arthritis   . Migraine    hx of, resolved   Past Surgical History:  Procedure Laterality Date  . COLONOSCOPY    . PARTIAL HYSTERECTOMY  2003   No Known Allergies No current facility-administered medications on file prior to encounter.   Current Outpatient Medications on File Prior to Encounter  Medication Sig Dispense Refill  . Cholecalciferol (VITAMIN D) 2000 units tablet Take 2,000 Units by mouth daily.    Marland Kitchen estradiol (CLIMARA - DOSED IN MG/24 HR) 0.025 mg/24hr patch Place 0.025 mg onto the skin once a week. (Patient not taking: Reported on 09/07/2020)    . Estradiol (ESTRACE PO) Take by mouth.     Social History   Socioeconomic History  . Marital status: Single    Spouse name: Not on file  . Number of children: 1  . Years of education: Not on file  . Highest education level: Not on file  Occupational History  . Occupation: [employer]  Tobacco Use  . Smoking status: Never Smoker  . Smokeless tobacco: Never Used  Vaping Use  . Vaping Use: Never used  Substance and Sexual Activity  . Alcohol use: No  . Drug use:  No  . Sexual activity: Not on file  Other Topics Concern  . Not on file  Social History Narrative  . Not on file   Social Determinants of Health   Financial Resource Strain:   . Difficulty of Paying Living Expenses: Not on file  Food Insecurity:   . Worried About Charity fundraiser in the Last Year: Not on file  . Ran Out of Food in the Last Year: Not on file  Transportation Needs:   . Lack of Transportation (Medical): Not on file  . Lack of Transportation (Non-Medical): Not on file  Physical Activity:   . Days of Exercise per Week: Not on file  . Minutes of Exercise per Session: Not on file  Stress:   . Feeling of Stress : Not on file  Social Connections:   . Frequency of Communication with Friends and Family: Not on file  . Frequency of Social Gatherings with Friends and Family: Not on file  . Attends Religious Services: Not on file  . Active Member of Clubs or Organizations: Not on file  . Attends Archivist Meetings: Not on file  . Marital Status: Not on file  Intimate Partner Violence:   . Fear of Current or Ex-Partner: Not on file  . Emotionally Abused: Not on file  . Physically Abused: Not on file  .  Sexually Abused: Not on file   Family History  Problem Relation Age of Onset  . Breast cancer Mother 56  . Kidney disease Father   . Stroke Father   . Arthritis Father   . Diabetes Maternal Grandmother        on insulin  . ALS Maternal Grandmother 83  . Hypertension Brother   . Colon polyps Son   . Colon cancer Neg Hx   . Esophageal cancer Neg Hx   . Rectal cancer Neg Hx   . Stomach cancer Neg Hx     OBJECTIVE:  Vitals:   09/30/20 1701  BP: (!) 151/98  Pulse: 90  Resp: 16  Temp: 98.9 F (37.2 C)  TempSrc: Oral  SpO2: 100%    General appearance: ALERT; in no acute distress.  Head: NCAT Lungs: Normal respiratory effort CV: pulses 2+ bilaterally. Cap refill < 2 seconds Musculoskeletal:  Inspection: Skin warm, dry, clear and intact  without obvious erythema, effusion, or ecchymosis.  Palpation: L trapezius tender to palpation ROM: limited ROM active and passive to L arm and neck Skin: warm and dry Neurologic: Ambulates without difficulty; Sensation intact about the upper/ lower extremities Psychological: alert and cooperative; normal mood and affect  DIAGNOSTIC STUDIES:  No results found.   ASSESSMENT & PLAN:  1. Cervical radiculopathy   2. Left arm pain   3. Left hand paresthesia     Meds ordered this encounter  Medications  . dexamethasone (DECADRON) injection 10 mg  . ketorolac (TORADOL) 30 MG/ML injection 30 mg  . cyclobenzaprine (FLEXERIL) 5 MG tablet    Sig: Take 1 tablet (5 mg total) by mouth 3 (three) times daily as needed for muscle spasms.    Dispense:  30 tablet    Refill:  0    Order Specific Question:   Supervising Provider    Answer:   Chase Picket [id number]  . ibuprofen (ADVIL) 800 MG tablet    Sig: Take 1 tablet (800 mg total) by mouth every 8 (eight) hours as needed for moderate pain.    Dispense:  21 tablet    Refill:  0    Order Specific Question:   Supervising Provider    Answer:   Chase Picket [id number]  . predniSONE (DELTASONE) 20 MG tablet    Sig: Take 1 tablet (20 mg total) by mouth daily with breakfast for 5 days.    Dispense:  5 tablet    Refill:  0    Order Specific Question:   Supervising Provider    Answer:   Chase Picket [[id number]]   Will treat for cervical radiculopathy Toradol 30 mg IM in office today Decadron 10 mg IM in office today Prescribed Flexeril Prescribed ibuprofen  Prescribed steroid  Continue conservative management of rest, ice, and gentle stretches Take ibuprofen as needed for pain relief (may cause abdominal discomfort, ulcers, and GI bleeds avoid taking with other NSAIDs) Take cyclobenzaprine at nighttime for symptomatic relief. Avoid driving or operating heavy machinery while using medication. Follow up with PCP if symptoms  persist Return or go to the ER if you have any new or worsening symptoms (fever, chills, chest pain, abdominal pain, changes in bowel or bladder habits, pain radiating into lower legs)   Reviewed expectations re: course of current medical issues. Questions answered. Outlined signs and symptoms indicating need for more acute intervention. Patient verbalized understanding. After Visit Summary given.       Faustino Congress, NP 09/30/20 1824

## 2020-10-02 ENCOUNTER — Ambulatory Visit (INDEPENDENT_AMBULATORY_CARE_PROVIDER_SITE_OTHER): Payer: 59

## 2020-10-02 ENCOUNTER — Other Ambulatory Visit: Payer: Self-pay

## 2020-10-02 ENCOUNTER — Ambulatory Visit: Payer: 59 | Admitting: Podiatry

## 2020-10-02 DIAGNOSIS — M205X1 Other deformities of toe(s) (acquired), right foot: Secondary | ICD-10-CM | POA: Diagnosis not present

## 2020-10-02 DIAGNOSIS — M7752 Other enthesopathy of left foot: Secondary | ICD-10-CM

## 2020-10-02 DIAGNOSIS — M79671 Pain in right foot: Secondary | ICD-10-CM

## 2020-10-02 DIAGNOSIS — M7751 Other enthesopathy of right foot: Secondary | ICD-10-CM

## 2020-10-02 DIAGNOSIS — M205X2 Other deformities of toe(s) (acquired), left foot: Secondary | ICD-10-CM | POA: Diagnosis not present

## 2020-10-02 DIAGNOSIS — M79672 Pain in left foot: Secondary | ICD-10-CM

## 2020-10-02 MED ORDER — DEXAMETHASONE SODIUM PHOSPHATE 120 MG/30ML IJ SOLN
4.0000 mg | Freq: Once | INTRAMUSCULAR | Status: AC
  Administered 2020-10-02: 4 mg via INTRA_ARTICULAR

## 2020-10-02 NOTE — Progress Notes (Signed)
°  Subjective:  Patient ID: Debra Silva, female    DOB: [birth year],  MRN: [id number]  Chief Complaint  Patient presents with   Foot Pain    Bilateral 1st toe and lateral foot aspect pain, lateral aspect is "burning" in quality.   Foot Pain    Bilateral generalized plantar foot pain, 24yr duration.   53 y.o. female presents with the above complaint. History confirmed with patient.   Objective:  Physical Exam: warm, good capillary refill, no trophic changes or ulcerative lesions, normal DP and PT pulses and normal sensory exam. Left Foot: pain on ROM 1st MPJ with crepitus  Right Foot: pain on ROM 1st MPJ with slight crepitus   No images are attached to the encounter.  Radiographs: X-ray of both feet: dorsal 1st metatarsal spurring, degenerative changes both 1st MPJs. Assessment:   1. Capsulitis of metatarsophalangeal (MTP) joint of right foot   2. Capsulitis of metatarsophalangeal (MTP) joint of left foot   3. Acquired hallux limitus of both feet    Plan:  Patient was evaluated and treated and all questions answered.  Capsulitis -Educated on etiology -XR reviewed with patient -Injection delivered to the painful joint  Procedure: Joint Injection Location: Bilateral 1st MPJ joint Skin Prep: Alcohol. Injectate: 0.5 cc 1% lidocaine plain, 0.5 cc dexamethasone phosphate. Disposition: Patient tolerated procedure well. Injection site dressed with a band-aid.  Return in about 3 weeks (around 10/23/2020) for Arthritis.

## 2020-10-03 ENCOUNTER — Ambulatory Visit
Admission: RE | Admit: 2020-10-03 | Discharge: 2020-10-03 | Disposition: A | Discharge status: Home / Self Care | Payer: 59 | Source: Ambulatory Visit | Attending: Gynecology | Admitting: Gynecology

## 2020-10-03 DIAGNOSIS — Z1231 Encounter for screening mammogram for malignant neoplasm of breast: Secondary | ICD-10-CM

## 2020-10-09 ENCOUNTER — Other Ambulatory Visit: Payer: Self-pay | Admitting: Podiatry

## 2020-10-09 DIAGNOSIS — M7752 Other enthesopathy of left foot: Secondary | ICD-10-CM

## 2020-10-09 DIAGNOSIS — M7751 Other enthesopathy of right foot: Secondary | ICD-10-CM

## 2020-11-13 ENCOUNTER — Other Ambulatory Visit: Payer: Self-pay

## 2020-11-13 ENCOUNTER — Ambulatory Visit: Payer: 59 | Admitting: Podiatry

## 2020-11-13 DIAGNOSIS — M7751 Other enthesopathy of right foot: Secondary | ICD-10-CM

## 2020-11-13 DIAGNOSIS — M24173 Other articular cartilage disorders, unspecified ankle: Secondary | ICD-10-CM

## 2020-11-13 DIAGNOSIS — E669 Obesity, unspecified: Secondary | ICD-10-CM | POA: Diagnosis not present

## 2020-11-13 DIAGNOSIS — M205X1 Other deformities of toe(s) (acquired), right foot: Secondary | ICD-10-CM | POA: Diagnosis not present

## 2020-11-13 DIAGNOSIS — M249 Joint derangement, unspecified: Secondary | ICD-10-CM | POA: Diagnosis not present

## 2020-11-13 DIAGNOSIS — M205X2 Other deformities of toe(s) (acquired), left foot: Secondary | ICD-10-CM

## 2020-11-13 NOTE — Progress Notes (Signed)
  Subjective:  Patient ID: Debra Silva, female    DOB: 1966/01/22,  MRN: [id number]  Chief Complaint  Patient presents with  . Foot Pain    Pt states no improvement and feels possibly worse.   55 y.o. female presents with the above complaint. History confirmed with patient.  States the injection helped for maybe a day.  She is still having pain with activity pain issues.  States that the pain is getting worse.  Objective:  Physical Exam: warm, good capillary refill, no trophic changes or ulcerative lesions, normal DP and PT pulses and normal sensory exam. Left Foot: pain on ROM 1st MPJ with crepitus  Right Foot: pain on ROM 1st MPJ with slight crepitus   No images are attached to the encounter.  Radiographs: 11/30 X-ray of both feet: dorsal 1st metatarsal spurring, degenerative changes both 1st MPJs. Assessment:   1. Capsulitis of metatarsophalangeal (MTP) joint of right foot   2. Acquired hallux limitus of both feet   3. Joint derangement of ankle or foot   4. Obesity (BMI 30-39.9)    Plan:  Patient was evaluated and treated and all questions answered.  Hallux limitus with first MPJ dorsal exostosis bilaterally -Discussed with patient proceeding with surgical intervention as she has failed conservative therapy and the injection does not seem to have helped her at all.  We did discuss that we could do this in a minimally invasive approach. -Patient has failed all conservative therapy and wishes to proceed with surgical intervention. All risks, benefits, and alternatives discussed with patient. No guarantees given. Consent reviewed and signed by patient. -Planned procedures: Right foot minimally invasive cheilectomy -Identified risk factors: obesity  No follow-ups on file.

## 2020-12-12 ENCOUNTER — Telehealth: Payer: Self-pay

## 2020-12-12 NOTE — Telephone Encounter (Signed)
DOS 12/19/2020  CHEILECTOMY RT - 28289  [employer] EFFECTIVE DATE - 08/03/2020  PLAN DEDUCTIBLE - $2000.00 W/ $1560.81 REMAINING OUT OF POCKET - $6000.00 W/ $5373.05 REMAINING COPAY $0.00 COINSURANCE - 20%   NOTIFICATION/PRIOR AUTHORIZATION NUMBER CASE STATUS CASE STATUS REASON PRIMARY CARE PHYSICIAN [id number] Closed Case Was Managed And Is Now Complete - ADVANCE NOTIFY DATE/TIME ADMISSION NOTIFY DATE/TIME 12/10/2020 03:11 PM CST - COVERAGE STATUS OVERALL COVERAGE STATUS Covered/Approved 1-1 CODE DESCRIPTION COVERAGE STATUS DECISION DATE [facility] Coverage determination is reflected for the facility admission and is not a guarantee of payment for ongoing services. Covered/Approved 12/11/2020 1 28289 Hallux rigidus correction with cheilecto more Covered/Approved 12/11/2020

## 2020-12-19 ENCOUNTER — Other Ambulatory Visit: Payer: Self-pay | Admitting: Podiatry

## 2020-12-19 DIAGNOSIS — M205X1 Other deformities of toe(s) (acquired), right foot: Secondary | ICD-10-CM

## 2020-12-19 MED ORDER — ONDANSETRON HCL 4 MG PO TABS: 4.0000 mg | ORAL_TABLET | Freq: Three times a day (TID) | ORAL | 0 refills | Status: DC | PRN

## 2020-12-19 MED ORDER — OXYCODONE-ACETAMINOPHEN 5-325 MG PO TABS
1.0000 | ORAL_TABLET | ORAL | 0 refills | Status: DC | PRN
Start: 2020-12-19 — End: 2021-02-12

## 2020-12-19 MED ORDER — CEPHALEXIN 500 MG PO CAPS: 500.0000 mg | ORAL_CAPSULE | Freq: Two times a day (BID) | ORAL | 0 refills | Status: DC

## 2020-12-19 NOTE — Progress Notes (Signed)
Rx sent to pharmacy for outpatient surgery. °

## 2020-12-25 ENCOUNTER — Ambulatory Visit (INDEPENDENT_AMBULATORY_CARE_PROVIDER_SITE_OTHER): Payer: 59

## 2020-12-25 ENCOUNTER — Other Ambulatory Visit: Payer: Self-pay | Admitting: Podiatry

## 2020-12-25 ENCOUNTER — Ambulatory Visit (INDEPENDENT_AMBULATORY_CARE_PROVIDER_SITE_OTHER): Payer: 59 | Admitting: Podiatry

## 2020-12-25 ENCOUNTER — Other Ambulatory Visit: Payer: Self-pay

## 2020-12-25 DIAGNOSIS — Z9889 Other specified postprocedural states: Secondary | ICD-10-CM | POA: Diagnosis not present

## 2020-12-25 DIAGNOSIS — M205X1 Other deformities of toe(s) (acquired), right foot: Secondary | ICD-10-CM

## 2020-12-25 DIAGNOSIS — M249 Joint derangement, unspecified: Secondary | ICD-10-CM

## 2020-12-25 DIAGNOSIS — M205X2 Other deformities of toe(s) (acquired), left foot: Secondary | ICD-10-CM | POA: Diagnosis not present

## 2020-12-25 DIAGNOSIS — E669 Obesity, unspecified: Secondary | ICD-10-CM

## 2020-12-25 DIAGNOSIS — M7751 Other enthesopathy of right foot: Secondary | ICD-10-CM

## 2020-12-25 DIAGNOSIS — M24173 Other articular cartilage disorders, unspecified ankle: Secondary | ICD-10-CM

## 2020-12-25 NOTE — Progress Notes (Signed)
  Subjective:  Patient ID: Debra Silva, female    DOB: 10-18-1966,  MRN: [id number]  Chief Complaint  Patient presents with  . Routine Post Op    POV#1 -pt denies N/V/F/Ch -dressing clean and intact, but very loose -pt states,' really no pain at sx site, just at the back of my leg; 3/10 muscle spasm." - no numbness Tx: sx shoe, icing, elevation    DOS: 12/19/20 Procedure: MIS cheilectomy right   55 y.o. female presents with the above complaint. History confirmed with patient.   Would like to plan surgery for her left foot. She has pain with the bump on the top like the right foot.  Objective:  Physical Exam: mild tenderness R 1st MPJ. Good ROM without crepitus. Incision: healing well, no significant drainage, no dehiscence, no significant erythema  No images are attached to the encounter.  Radiographs: X-ray of the right foot: successful reduction of spurring  X-ray of the left foot: 1st MPJ joint degeneration with dorsal spur.   Assessment:   1. Hallux limitus, left   2. Capsulitis of metatarsophalangeal (MTP) joint of right foot   3. Acquired hallux limitus of both feet   4. Joint derangement of ankle or foot   5. Obesity (BMI 30-39.9)     Plan:  Patient was evaluated and treated and all questions answered.  Post-operative State -XR reviewed with patient -Dressing applied consisting of band-aid -WBAT in Surgical shoe  -Ok to shower  Left foot hallux limitus -Patient has failed all conservative therapy and wishes to proceed with surgical intervention. All risks, benefits, and alternatives discussed with patient. No guarantees given. Consent reviewed and signed by patient. -Planned procedures: Left foot MIS cheilectomy -ASA 2 - Patient with mild systemic disease with no functional limitations    No follow-ups on file.

## 2021-01-11 ENCOUNTER — Other Ambulatory Visit: Payer: Self-pay

## 2021-01-11 ENCOUNTER — Ambulatory Visit (INDEPENDENT_AMBULATORY_CARE_PROVIDER_SITE_OTHER): Payer: 59 | Admitting: Podiatry

## 2021-01-11 DIAGNOSIS — M24173 Other articular cartilage disorders, unspecified ankle: Secondary | ICD-10-CM

## 2021-01-11 DIAGNOSIS — M249 Joint derangement, unspecified: Secondary | ICD-10-CM

## 2021-01-11 DIAGNOSIS — M205X2 Other deformities of toe(s) (acquired), left foot: Secondary | ICD-10-CM

## 2021-01-11 DIAGNOSIS — M7751 Other enthesopathy of right foot: Secondary | ICD-10-CM

## 2021-01-11 DIAGNOSIS — M205X1 Other deformities of toe(s) (acquired), right foot: Secondary | ICD-10-CM

## 2021-01-11 NOTE — Progress Notes (Addendum)
  Subjective:  Patient ID: Debra Silva, female    DOB: November 04, 1965,  MRN: [id number]  Chief Complaint  Patient presents with  . Routine Post Op    POV#2 Pt states still experiencing same burning pain on dorsal aspect as prior to surgery. Pt does state improvement overall. Denies fever/chills/nausea/vomiting.    DOS: 12/19/20 Procedure: MIS cheilectomy right  55 y.o. female presents with the above complaint. History confirmed with patient. Overall doing well, walking in her surgical shoe.  Objective:  Physical Exam: mild pain with dorsal 1st MPJ excursion, good ROM noted. Joint motion fluid. Incision: healed  Assessment:   1. Capsulitis of metatarsophalangeal (MTP) joint of right foot   2. Acquired hallux limitus of both feet   3. Joint derangement of ankle or foot     Plan:  Patient was evaluated and treated and all questions answered.  Post-operative State -Transition to normal shoegear as tolerated. Return to activity slowly as tolerated. Work on ROM exercises.  Return in about 2 weeks (around 01/25/2021) for Post-Op (No XRs).

## 2021-01-25 ENCOUNTER — Other Ambulatory Visit: Payer: Self-pay

## 2021-01-25 ENCOUNTER — Ambulatory Visit (INDEPENDENT_AMBULATORY_CARE_PROVIDER_SITE_OTHER): Payer: 59 | Admitting: Podiatry

## 2021-01-25 DIAGNOSIS — M7751 Other enthesopathy of right foot: Secondary | ICD-10-CM

## 2021-01-25 DIAGNOSIS — Z9889 Other specified postprocedural states: Secondary | ICD-10-CM

## 2021-01-25 DIAGNOSIS — M205X1 Other deformities of toe(s) (acquired), right foot: Secondary | ICD-10-CM

## 2021-01-25 DIAGNOSIS — M205X2 Other deformities of toe(s) (acquired), left foot: Secondary | ICD-10-CM

## 2021-01-31 NOTE — Progress Notes (Signed)
  Subjective:  Patient ID: Debra Silva, female    DOB: May 29, 1966,  MRN: [id number]  Chief Complaint  Patient presents with  . Routine Post Op    POV #2 DOS 12/16/2020 CHEILECTOMY LT. No N/V, fever or chills. Pt states she has be doing well.     DOS: 12/19/20 Procedure: MIS cheilectomy right  55 y.o. female presents with the above complaint. History confirmed with patient.  Doing very well no pain or discomfort.  Mild swelling.  Objective:  Physical Exam: mild pain with dorsal 1st MPJ excursion, good ROM noted. Joint motion fluid. Incision: healed  Assessment:   1. Capsulitis of metatarsophalangeal (MTP) joint of right foot   2. Acquired hallux limitus of both feet   3. Post-operative state     Plan:  Patient was evaluated and treated and all questions answered.  Post-operative State -Continues to do well.  Pending surgery for her right foot.  Follow-up after that procedure.  Continue normal shoe gear on the right  No follow-ups on file.

## 2021-02-04 ENCOUNTER — Telehealth: Payer: Self-pay | Admitting: Urology

## 2021-02-04 NOTE — Telephone Encounter (Signed)
DOS - 02/13/21  CHEILECTOMY LEFT --- 49355  [employer]HC EFFECTIVE DATE - 08/03/20  PLAN DEDUCTIBLE - $2,000.00 W/ $0.00 REMAINING   OUT OF POCKET - $6,000.00 W/  $3,294.05 REMAINING COPAY - $0.00 COINSURANCE - 20%  RECEIVED FAX FROM [facility] CPT CODE 21747 WAS APPROVED, [facility] # [id number] GOOD FROM 02/13/2021 - 05/14/2021.

## 2021-02-12 ENCOUNTER — Telehealth: Payer: Self-pay | Admitting: Podiatry

## 2021-02-12 MED ORDER — CEPHALEXIN 500 MG PO CAPS
500.0000 mg | ORAL_CAPSULE | Freq: Two times a day (BID) | ORAL | 0 refills | Status: DC
Start: 2021-02-12 — End: 2021-09-18

## 2021-02-12 MED ORDER — OXYCODONE-ACETAMINOPHEN 5-325 MG PO TABS: 1.0000 | ORAL_TABLET | ORAL | 0 refills | Status: DC | PRN

## 2021-02-12 MED ORDER — ONDANSETRON HCL 4 MG PO TABS: 4.0000 mg | ORAL_TABLET | Freq: Three times a day (TID) | ORAL | 0 refills | Status: DC | PRN

## 2021-02-12 NOTE — Telephone Encounter (Signed)
Sent medications for surgery to pharmacy.

## 2021-02-13 ENCOUNTER — Encounter: Payer: Self-pay | Admitting: Podiatry

## 2021-02-13 DIAGNOSIS — M2022 Hallux rigidus, left foot: Secondary | ICD-10-CM

## 2021-02-19 ENCOUNTER — Ambulatory Visit (INDEPENDENT_AMBULATORY_CARE_PROVIDER_SITE_OTHER): Payer: 59

## 2021-02-19 ENCOUNTER — Ambulatory Visit (INDEPENDENT_AMBULATORY_CARE_PROVIDER_SITE_OTHER): Payer: 59 | Admitting: Podiatry

## 2021-02-19 ENCOUNTER — Encounter: Payer: 59 | Admitting: Podiatry

## 2021-02-19 ENCOUNTER — Other Ambulatory Visit: Payer: Self-pay

## 2021-02-19 DIAGNOSIS — Z9889 Other specified postprocedural states: Secondary | ICD-10-CM

## 2021-02-19 DIAGNOSIS — M205X1 Other deformities of toe(s) (acquired), right foot: Secondary | ICD-10-CM

## 2021-02-19 DIAGNOSIS — M205X2 Other deformities of toe(s) (acquired), left foot: Secondary | ICD-10-CM

## 2021-02-19 DIAGNOSIS — M7751 Other enthesopathy of right foot: Secondary | ICD-10-CM

## 2021-02-23 NOTE — Progress Notes (Signed)
  Subjective:  Patient ID: Debra Silva, female    DOB: Apr 12, 1966,  MRN: [id number]  Chief Complaint  Patient presents with  . Routine Post Op    PT stated that she has been having a lot of pain     DOS: 02/12/2021 Procedure: Left foot cheilectomy first MTP  55 y.o. female returns for post-op check.  Said "a lot of pain and swelling  Review of Systems: Negative except as noted in the HPI. Denies N/V/F/Ch.   Objective:  There were no vitals filed for this visit. There is no height or weight on file to calculate BMI. Constitutional Well developed. Well nourished.  Vascular Foot warm and well perfused. Capillary refill normal to all digits.   Neurologic Normal speech. Oriented to person, place, and time. Epicritic sensation to light touch grossly present bilaterally.  Dermatologic Skin healing well without signs of infection. Skin edges well coapted without signs of infection.  Orthopedic: Tenderness to palpation noted about the surgical site.  Significant edema   Radiographs: Soft tissue edema: Good reduction of spur Assessment:   1. Post-operative state    Plan:  Patient was evaluated and treated and all questions answered.  S/p foot surgery left -Progressing as expected post-operatively.  She does have a lot of swelling.  I advised her to keep icing elevating and maintaining compression on this to reduce swelling. -XR: As above -WB Status: WBAT in surgical shoe  -Sutures: She may begin bathing.  Remove sutures next week  Return in about 1 week (around 02/26/2021) for suture removal .

## 2021-02-26 ENCOUNTER — Ambulatory Visit (INDEPENDENT_AMBULATORY_CARE_PROVIDER_SITE_OTHER): Payer: 59 | Admitting: Podiatry

## 2021-02-26 ENCOUNTER — Other Ambulatory Visit: Payer: Self-pay

## 2021-02-26 DIAGNOSIS — Z9889 Other specified postprocedural states: Secondary | ICD-10-CM

## 2021-02-26 DIAGNOSIS — M7751 Other enthesopathy of right foot: Secondary | ICD-10-CM

## 2021-02-26 NOTE — Progress Notes (Signed)
  Subjective:  Patient ID: Debra Silva, female    DOB: Aug 16, 1966,  MRN: [id number]  Chief Complaint  Patient presents with  . Routine Post Op    SUTURE REMOVAL POV #2 DOS 02/13/2021 CHEILECTOMY LT. Pt states she is in a lot of pain and has been experiencing edema. Sutures have been removed.     DOS: 02/13/21 Procedure: Cheilectomy left  55 y.o. female presents with the above complaint. History confirmed with patient. States the pain and swelling is down but had more swelling with this surgery than the other.  Objective:  Physical Exam: tenderness at the surgical site, local edema noted and calf supple, nontender. Incision: healing well, no significant drainage, no dehiscence, no significant erythema Assessment:   1. Post-operative state   2. Capsulitis of metatarsophalangeal (MTP) joint of right foot     Plan:  Patient was evaluated and treated and all questions answered.  Post-operative State -Ok to start showering at this time. Advised they cannot soak. -WBAT in Surgical shoe -Ok to transition to normal shoes as tolerated.  Return in about 3 weeks (around 03/19/2021) for Post-Op (No XRs).

## 2021-03-05 ENCOUNTER — Encounter: Payer: 59 | Admitting: Podiatry

## 2021-03-05 ENCOUNTER — Encounter: Payer: Self-pay | Admitting: Podiatry

## 2021-03-19 ENCOUNTER — Ambulatory Visit (INDEPENDENT_AMBULATORY_CARE_PROVIDER_SITE_OTHER): Payer: 59 | Admitting: Podiatry

## 2021-03-19 ENCOUNTER — Other Ambulatory Visit: Payer: Self-pay

## 2021-03-19 DIAGNOSIS — M7751 Other enthesopathy of right foot: Secondary | ICD-10-CM

## 2021-03-19 DIAGNOSIS — Z9889 Other specified postprocedural states: Secondary | ICD-10-CM

## 2021-03-19 NOTE — Progress Notes (Signed)
  Subjective:  Patient ID: Debra Silva, female    DOB: August 26, 1966,  MRN: [id number]  Chief Complaint  Patient presents with  . Routine Post Op    Post-Op (No XRs). POV #3 DOS 02/13/2021 CHEILECTOMY LT. Pt states she is doing well.     DOS: 02/13/21 Procedure: Cheilectomy left  55 y.o. female presents with the above complaint. History confirmed with patient. Doing well denies pain able to wear normal shoegear all day without issues.  Objective:  Physical Exam: no tenderness mild PF ROM tightness, reducible on ROM. Incision: well healed Assessment:   1. Post-operative state   2. Capsulitis of metatarsophalangeal (MTP) joint of right foot     Plan:  Patient was evaluated and treated and all questions answered.  Post-operative State -Doing very well -Continue normal shoegear -Continue ROM exercises.  Return in about 6 weeks (around 04/30/2021) for Post-Op (No XRs).

## 2021-03-30 IMAGING — MG DIGITAL SCREENING BILAT W/ TOMO W/ CAD
8 series · 8 of 24 positions shown · non-contrast
Comparison: Previous exam(s).

CLINICAL DATA: Screening.

EXAM:
DIGITAL SCREENING BILATERAL MAMMOGRAM WITH TOMO AND CAD

[R CC synth-2D]
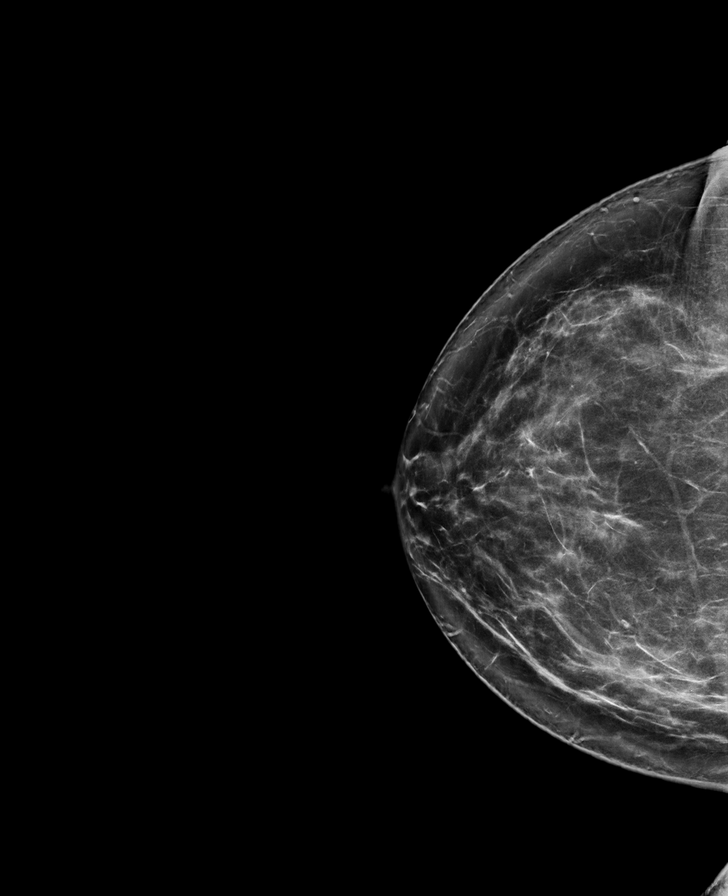

[L CC synth-2D]
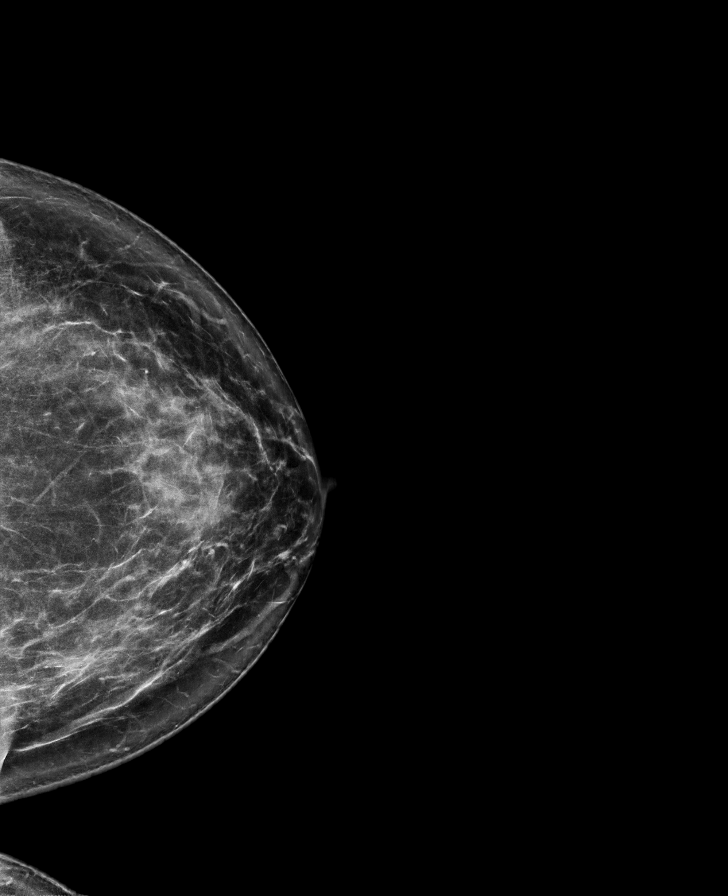

[L MLO synth-2D]
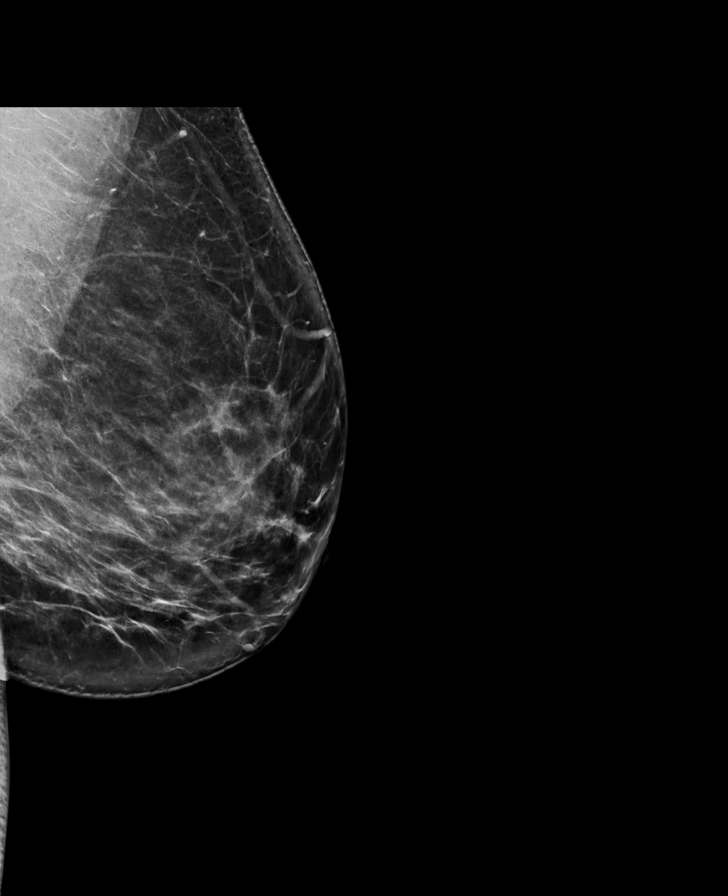

[R MLO synth-2D]
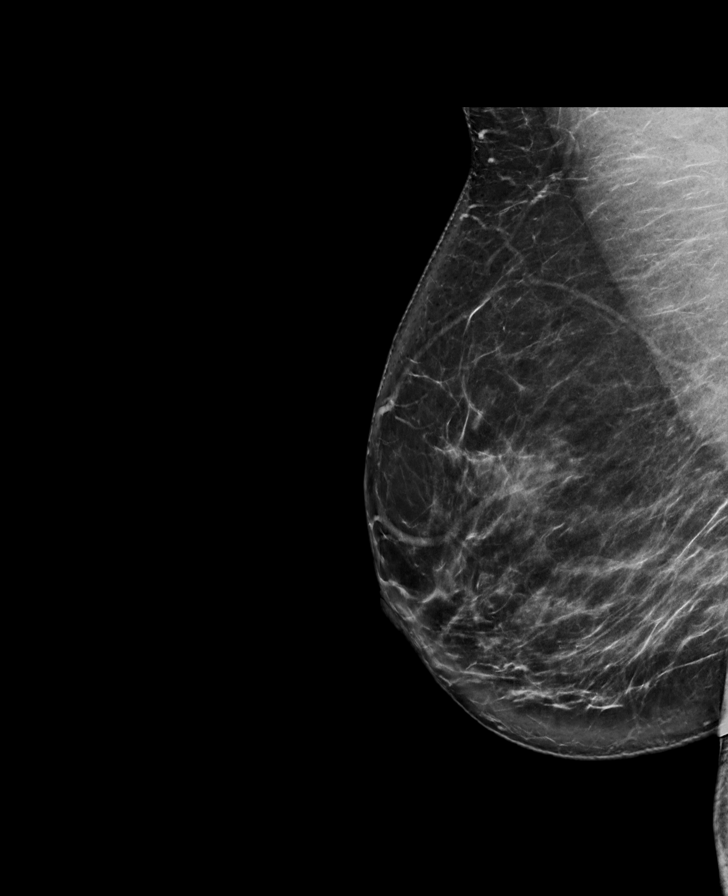

[R MLO tomo · tomo slice 47/93.0]
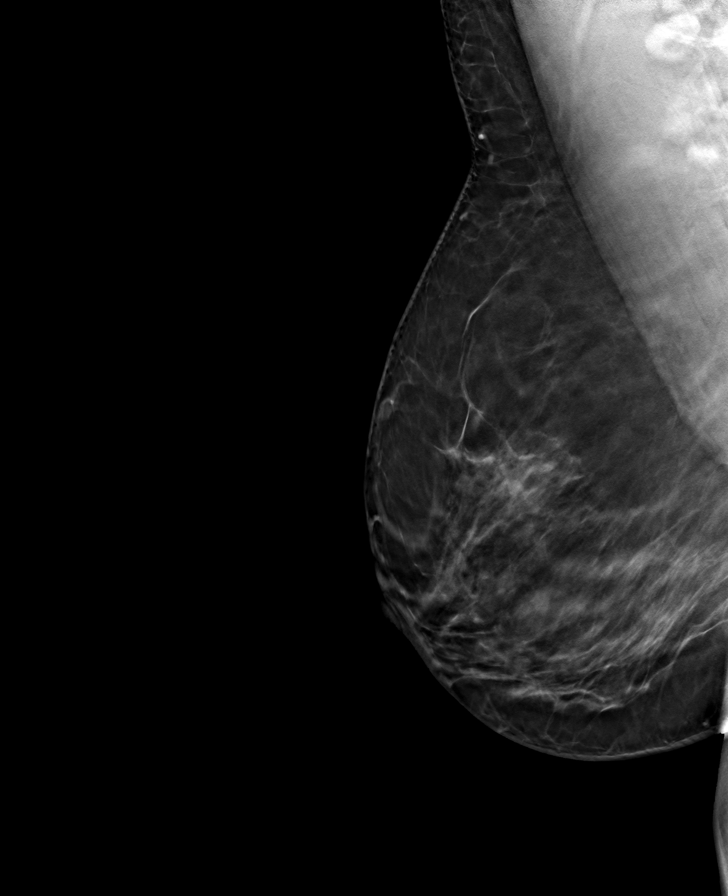

[L CC tomo · tomo slice 43/86.0]
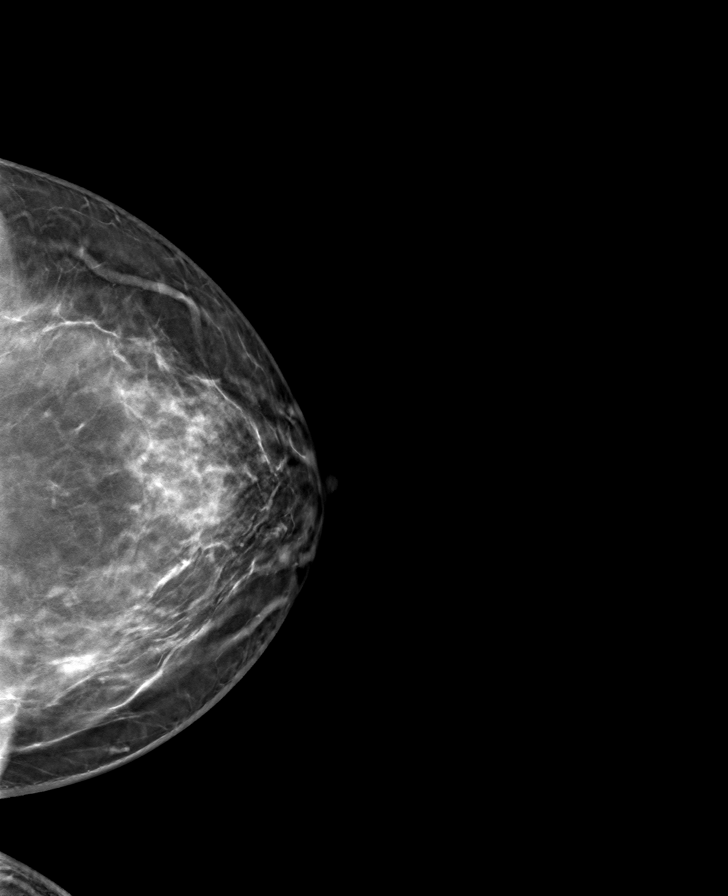

[R CC tomo · tomo slice 46/91.0]
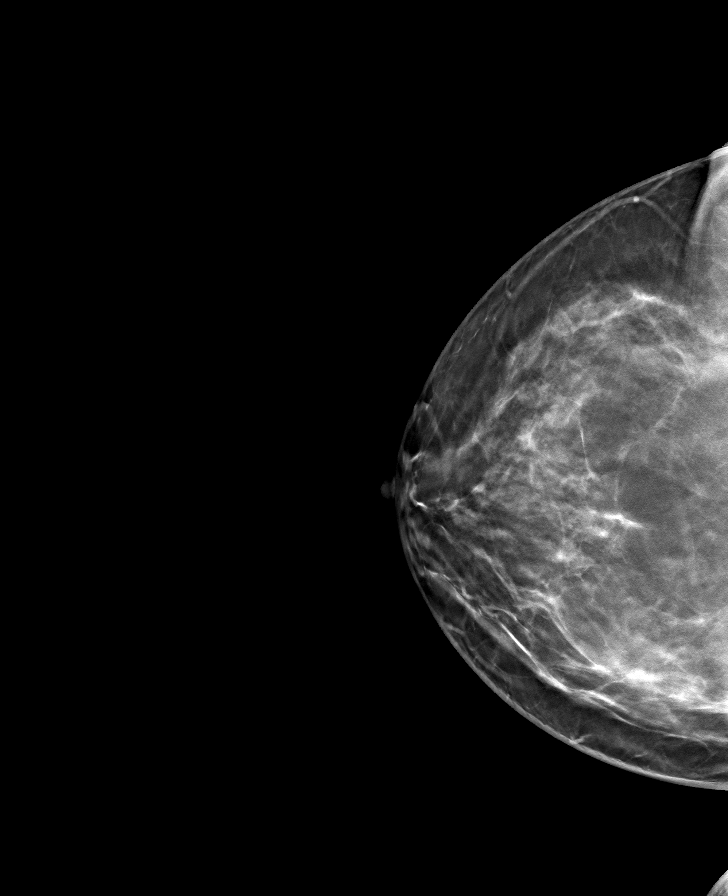

[L MLO tomo · tomo slice 47/92.0]
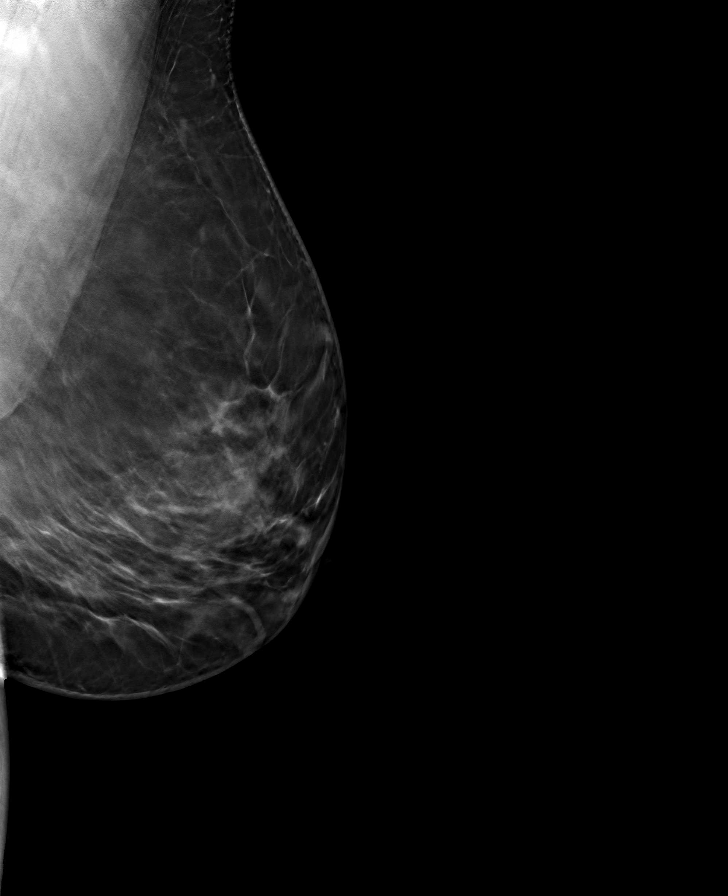

[8 of 24 positions shown; findings below may reference images not displayed]

ACR Breast Density Category c: The breast tissue is heterogeneously
dense, which may obscure small masses.
FINDINGS: There are no findings suspicious for malignancy. Images were
processed with CAD.
IMPRESSION: No mammographic evidence of malignancy. A result letter of this
screening mammogram will be mailed directly to the patient.

RECOMMENDATION:
Screening mammogram in one year. (Code:FT-U-LHB)

BI-RADS CATEGORY  1: Negative.

## 2021-04-30 ENCOUNTER — Ambulatory Visit (INDEPENDENT_AMBULATORY_CARE_PROVIDER_SITE_OTHER): Payer: 59 | Admitting: Podiatry

## 2021-04-30 ENCOUNTER — Other Ambulatory Visit: Payer: Self-pay

## 2021-04-30 DIAGNOSIS — M7751 Other enthesopathy of right foot: Secondary | ICD-10-CM

## 2021-04-30 DIAGNOSIS — M205X1 Other deformities of toe(s) (acquired), right foot: Secondary | ICD-10-CM

## 2021-04-30 DIAGNOSIS — Z9889 Other specified postprocedural states: Secondary | ICD-10-CM

## 2021-04-30 DIAGNOSIS — M205X2 Other deformities of toe(s) (acquired), left foot: Secondary | ICD-10-CM

## 2021-04-30 NOTE — Progress Notes (Signed)
  Subjective:  Patient ID: Debra Silva, female    DOB: 1966-09-07,  MRN: [id number]  Chief Complaint  Patient presents with   Routine Post Op    Reports pain is even more than before surgery in B/L feet. Reports pain all over top of left foot and in great hallux of right foot.     DOS: 02/13/21 Procedure: Cheilectomy left  55 y.o. female presents with the above complaint. History confirmed with patient. Thinks her feet have been hurting more recently but was previously doing well and having no pain.  Objective:  Physical Exam: Reduced ROM 1st MPJ bilat, L>R. No crepitus on ROM. Improved ROM with manual stretch. Incision: well healed Assessment:   1. Post-operative state   2. Capsulitis of metatarsophalangeal (MTP) joint of right foot   3. Acquired hallux limitus of both feet      Plan:  Patient was evaluated and treated and all questions answered.  Post-operative State -She has a slight ROM restrictin left, mild right. Reducible on manual stretch. Patient to continue ROM exercises and f/u in 6 weeks for final recheck.  No follow-ups on file.

## 2021-06-11 ENCOUNTER — Ambulatory Visit (INDEPENDENT_AMBULATORY_CARE_PROVIDER_SITE_OTHER): Payer: 59 | Admitting: Podiatry

## 2021-06-11 ENCOUNTER — Other Ambulatory Visit: Payer: Self-pay

## 2021-06-11 ENCOUNTER — Ambulatory Visit (INDEPENDENT_AMBULATORY_CARE_PROVIDER_SITE_OTHER): Payer: 59

## 2021-06-11 DIAGNOSIS — M21622 Bunionette of left foot: Secondary | ICD-10-CM

## 2021-06-11 DIAGNOSIS — M205X2 Other deformities of toe(s) (acquired), left foot: Secondary | ICD-10-CM

## 2021-06-11 DIAGNOSIS — M7751 Other enthesopathy of right foot: Secondary | ICD-10-CM | POA: Diagnosis not present

## 2021-06-11 DIAGNOSIS — Z9889 Other specified postprocedural states: Secondary | ICD-10-CM

## 2021-06-11 DIAGNOSIS — M205X1 Other deformities of toe(s) (acquired), right foot: Secondary | ICD-10-CM | POA: Diagnosis not present

## 2021-06-11 MED ORDER — BETAMETHASONE SOD PHOS & ACET 6 (3-3) MG/ML IJ SUSP
3.0000 mg | Freq: Once | INTRAMUSCULAR | Status: AC
  Administered 2021-06-11: 3 mg

## 2021-06-11 NOTE — Progress Notes (Signed)
  Subjective:  Patient ID: Debra Silva, female    DOB: [birth year],  MRN: [id number]  Chief Complaint  Patient presents with   Routine Post Op    POV #4 DOS 02/13/2021 CHEILECTOMY LT. Pt states she is still having pain her her left foot.    55 y.o. female presents with the above complaint. History confirmed with patient. Pain is not at surgical area, more in the outside of the foot than the bunion area.  Objective:  Physical Exam: warm, good capillary refill, no trophic changes or ulcerative lesions, normal DP and PT pulses, and normal sensory exam. Left Foot: POP 5th MPJ with prominent bursa and tailor's bunion deformity. Mild decreased ROM 1st MPJ without pain.    No images are attached to the encounter.  Radiographs: X-ray of the left foot: stable degenerative changes 1st MPJ, tailor's bunion 5th MPJ. No acute fractures. Assessment:   1. Tailor's bunion of left foot   2. Capsulitis of metatarsophalangeal (MTP) joint of right foot   3. Acquired hallux limitus of both feet    Plan:  Patient was evaluated and treated and all questions answered.  Hallux limitus bilat -Doing very well no pain at these areas today  Capsulitis -Educated on etiology -XR reviewed with patient -Injection delivered to the painful joint  Procedure: Joint Injection Location: Left 5th MPJ  bursa Skin Prep: Alcohol. Injectate: 0.5 cc 1% lidocaine plain, 0.5 cc betamethasone acetate-betamethasone sodium phosphate Disposition: Patient tolerated procedure well. Injection site dressed with a band-aid.  Return in about 1 month (around 07/12/2021) for Tailor's bunion f/u.

## 2021-07-16 ENCOUNTER — Ambulatory Visit: Payer: 59 | Admitting: Podiatry

## 2021-07-16 ENCOUNTER — Other Ambulatory Visit: Payer: Self-pay

## 2021-07-16 DIAGNOSIS — M205X2 Other deformities of toe(s) (acquired), left foot: Secondary | ICD-10-CM | POA: Diagnosis not present

## 2021-07-16 DIAGNOSIS — M205X1 Other deformities of toe(s) (acquired), right foot: Secondary | ICD-10-CM | POA: Diagnosis not present

## 2021-07-16 DIAGNOSIS — M21622 Bunionette of left foot: Secondary | ICD-10-CM | POA: Diagnosis not present

## 2021-07-16 DIAGNOSIS — M7751 Other enthesopathy of right foot: Secondary | ICD-10-CM | POA: Diagnosis not present

## 2021-07-22 NOTE — Progress Notes (Signed)
  Subjective:  Patient ID: Debra Silva, female    DOB: 19-Jan-1966,  MRN: [id number]  Chief Complaint  Patient presents with   Bunions     Return in about 1 month (around 07/12/2021) for Tailor's bunion f/u left   55 y.o. female presents with the above complaint. History confirmed with patient.  Not having any pain at the surgical sites but still having pain at the tailor's bunion.  Objective:  Physical Exam: warm, good capillary refill, no trophic changes or ulcerative lesions, normal DP and PT pulses, and normal sensory exam. Left Foot: POP 5th MPJ with prominent bursa and tailor's bunion deformity. Mild decreased ROM 1st MPJ without pain.    Assessment:   1. Tailor's bunion of left foot   2. Capsulitis of metatarsophalangeal (MTP) joint of right foot   3. Acquired hallux limitus of both feet     Plan:  Patient was evaluated and treated and all questions answered.  Hallux limitus bilat -Doing very well no pain at these areas today  Tailor's bunion -She is having more pain at the tailor's bunion left foot.  She is not interested in surgical invention at this time.  We did discuss that should she like we could consider surgical invention for this area including a minimally invasive approach. Follow-up should issues persist  No follow-ups on file.

## 2021-08-19 ENCOUNTER — Other Ambulatory Visit: Payer: Self-pay | Admitting: Gynecology

## 2021-08-19 DIAGNOSIS — Z1231 Encounter for screening mammogram for malignant neoplasm of breast: Secondary | ICD-10-CM

## 2021-09-13 ENCOUNTER — Encounter: Payer: 59 | Admitting: Physician Assistant

## 2021-09-18 ENCOUNTER — Other Ambulatory Visit: Payer: Self-pay

## 2021-09-18 ENCOUNTER — Encounter: Payer: Self-pay | Admitting: Physician Assistant

## 2021-09-18 ENCOUNTER — Ambulatory Visit (INDEPENDENT_AMBULATORY_CARE_PROVIDER_SITE_OTHER): Payer: Managed Care, Other (non HMO) | Admitting: Physician Assistant

## 2021-09-18 VITALS — BP 122/80 | HR 87 | Temp 97.6°F | Ht 68.0 in | Wt 236.5 lb

## 2021-09-18 DIAGNOSIS — Z0000 Encounter for general adult medical examination without abnormal findings: Secondary | ICD-10-CM | POA: Diagnosis not present

## 2021-09-18 DIAGNOSIS — E669 Obesity, unspecified: Secondary | ICD-10-CM

## 2021-09-18 DIAGNOSIS — E8881 Metabolic syndrome: Secondary | ICD-10-CM | POA: Diagnosis not present

## 2021-09-18 DIAGNOSIS — E559 Vitamin D deficiency, unspecified: Secondary | ICD-10-CM

## 2021-09-18 NOTE — Patient Instructions (Signed)
It was great to see you!  I'm putting in the referral for you to see the nutritionist.  Please go to the lab for blood work.   Our office will call you with your results unless you have chosen to receive results via MyChart.  If your blood work is normal we will follow-up each year for physicals and as scheduled for chronic medical problems.  If anything is abnormal we will treat accordingly and get you in for a follow-up.  Take care,  Aldona Bar

## 2021-09-18 NOTE — Progress Notes (Signed)
Subjective:    Debra Silva is a 55 y.o. female and is here for a comprehensive physical exam.  HPI  Health Maintenance Due  Topic Date Due   Hepatitis C Screening  Never done    Acute Concerns: Obesity/Insulin Resistance Debra Silva expresses frustration due to inability to lose weight. She states she is a pescatarian and doesn't eat out or have a large intake of sugar. Since her left foot cheilectomy she hasn't been able to exercise as much as she wants to. Despite this she does make an effort to lift weights occasionally and walk for at least thirty minutes. At this time she is not interested in medication intervention but wants to  meet with a nutritionist to discuss more options to improve her diet.   Chronic Issues: Vitamin D Deficiency Currently compliant with vitamin d supplement 2000 units with no adverse effects. She is managing well.   Health Maintenance: Immunizations -- COVID- Last completed 09/07/20( Pfizer- 1 dose & Moderna- 1 dose) Influenza- Not completed due to decline Tdap- Last completed 10/12/13 Colonoscopy -- Last completed 08/09/18 Mammogram -- Last completed 10/03/20 PAP -- N/A due to hysterectomy  Bone Density -- N/A Diet -- She is a pescatarian; mainly drinks water and occasional juice intake  Sleep habits -- Normal  Ophthalmology- UTD Dentistry- UTD Exercise -- Decreased in walking due to past foot surgery Weight -- Stable; Interested in seeing a nutritionist  Mood -- Stable  Weight history: Wt Readings from Last 10 Encounters:  09/18/21 236 lb 8 oz (107.3 kg)  09/07/20 238 lb 3.2 oz (108 kg)  02/13/20 235 lb 3.2 oz (106.7 kg)  02/08/20 237 lb 6.4 oz (107.7 kg)  08/17/19 234 lb (106.1 kg)  08/09/18 226 lb (102.5 kg)  08/04/18 226 lb (102.5 kg)  07/30/18 227 lb (103 kg)  02/18/18 227 lb 9.6 oz (103.2 kg)  07/15/17 234 lb 12.8 oz (106.5 kg)   Body mass index is 35.96 kg/m. No LMP recorded. Patient has had a hysterectomy. Alcohol use:  reports  no history of alcohol use. Tobacco use: Never Tobacco Use: Low Risk    Smoking Tobacco Use: Never   Smokeless Tobacco Use: Never   Passive Exposure: Not on file     Depression screen [facility] 2/9 09/18/2021  Decreased Interest 0  Down, Depressed, Hopeless 0  PHQ - 2 Score 0     Other providers/specialists: Patient Care Team: Inda Coke, [place] as PCP - General (Physician Assistant) Pyrtle, Lajuan Lines, MD (Gastroenterology) Key, Nelia Shi, NP as Nurse Practitioner (Gynecology)    PMHx, SurgHx, SocialHx, Medications, and Allergies were reviewed in the Visit Navigator and updated as appropriate.   Past Medical History:  Diagnosis Date   Arthritis    Migraine    hx of, resolved     Past Surgical History:  Procedure Laterality Date   COLONOSCOPY     PARTIAL HYSTERECTOMY  2003     Family History  Problem Relation Age of Onset   Breast cancer Mother 60   Kidney disease Father    Stroke Father    Arthritis Father    Diabetes Maternal Grandmother        on insulin   ALS Maternal Grandmother 83   Hypertension Brother    Colon polyps Son    Colon cancer Neg Hx    Esophageal cancer Neg Hx    Rectal cancer Neg Hx    Stomach cancer Neg Hx     Social History  Tobacco Use   Smoking status: Never   Smokeless tobacco: Never  Vaping Use   Vaping Use: Never used  Substance Use Topics   Alcohol use: No   Drug use: No    Review of Systems:   Review of Systems  Constitutional:  Negative for chills, fever, malaise/fatigue and weight loss.  HENT:  Negative for hearing loss, sinus pain and sore throat.   Respiratory:  Negative for cough and hemoptysis.   Cardiovascular:  Negative for chest pain, palpitations, leg swelling and PND.  Gastrointestinal:  Negative for abdominal pain, constipation, diarrhea, heartburn, nausea and vomiting.  Genitourinary:  Negative for dysuria, frequency and urgency.  Musculoskeletal:  Negative for back pain, myalgias and neck pain.  Skin:   Negative for itching and rash.  Neurological:  Negative for dizziness, tingling, seizures and headaches.  Endo/Heme/Allergies:  Negative for polydipsia.  Psychiatric/Behavioral:  Negative for depression. The patient is not nervous/anxious.    Objective:   BP 122/80 (BP Location: Left Arm, Patient Position: Sitting, Cuff Size: Large)   Pulse 87   Temp 97.6 F (36.4 C) (Temporal)   Ht 5\' 8"  (1.727 m)   Wt 236 lb 8 oz (107.3 kg)   SpO2 96%   BMI 35.96 kg/m  Body mass index is 35.96 kg/m.   General Appearance:    Alert, cooperative, no distress, appears stated age  Head:    Normocephalic, without obvious abnormality, atraumatic  Eyes:    PERRL, conjunctiva/corneas clear, EOM's intact, fundi    benign, both eyes  Ears:    Normal TM's and external ear canals, both ears  Nose:   Nares normal, septum midline, mucosa normal, no drainage    or sinus tenderness  Throat:   Lips, mucosa, and tongue normal; teeth and gums normal  Neck:   Supple, symmetrical, trachea midline, no adenopathy;    thyroid:  no enlargement/tenderness/nodules; no carotid   bruit or JVD  Back:     Symmetric, no curvature, ROM normal, no CVA tenderness  Lungs:     Clear to auscultation bilaterally, respirations unlabored  Chest Wall:    No tenderness or deformity   Heart:    Regular rate and rhythm, S1 and S2 normal, no murmur, rub or gallop  Breast Exam:    Deferred  Abdomen:     Soft, non-tender, bowel sounds active all four quadrants,    no masses, no organomegaly  Genitalia:    Deferred  Extremities:   Extremities normal, atraumatic, no cyanosis or edema  Pulses:   2+ and symmetric all extremities  Skin:   Skin color, texture, turgor normal, no rashes or lesions  Lymph nodes:   Cervical, supraclavicular, and axillary nodes normal  Neurologic:   CNII-XII intact, normal strength, sensation and reflexes    throughout    Assessment/Plan:   Routine physical examination Today patient counseled on age  appropriate routine health concerns for screening and prevention, each reviewed and up to date or declined. Immunizations reviewed and up to date or declined. Labs ordered and reviewed. Risk factors for depression reviewed and negative. Hearing function and visual acuity are intact. ADLs screened and addressed as needed. Functional ability and level of safety reviewed and appropriate. Education, counseling and referrals performed based on assessed risks today. Patient provided with a copy of personalized plan for preventive services.  Obesity/Insulin Resistance Will send a referral to nutritionist in an effort to improve her diet  She will consider medications but is not ready to discuss today  Vit D Deficiency Update Vit D level and provide recommendations to supplementation accordingly.   Patient Counseling: [x]    Nutrition: Stressed importance of moderation in sodium/caffeine intake, saturated fat and cholesterol, caloric balance, sufficient intake of fresh fruits, vegetables, fiber, calcium, iron, and 1 mg of folate supplement per day (for females capable of pregnancy).  [x]    Stressed the importance of regular exercise.   [x]    Substance Abuse: Discussed cessation/primary prevention of tobacco, alcohol, or other drug use; driving or other dangerous activities under the influence; availability of treatment for abuse.   [x]    Injury prevention: Discussed safety belts, safety helmets, smoke detector, smoking near bedding or upholstery.   [x]    Sexuality: Discussed sexually transmitted diseases, partner selection, use of condoms, avoidance of unintended pregnancy  and contraceptive alternatives.  [x]    Dental health: Discussed importance of regular tooth brushing, flossing, and dental visits.  [x]    Health maintenance and immunizations reviewed. Please refer to Health maintenance section.   Faythe Dingwall, PA, have reviewed all documentation for this visit. The documentation on 09/18/21 for  the exam, diagnosis, procedures, and orders are all accurate and complete.   Inda Coke, PA-C [place]

## 2021-09-19 LAB — HEMOGLOBIN A1C: Hgb A1c MFr Bld: 6.1 % (ref 4.6–6.5)

## 2021-09-19 LAB — CBC WITH DIFFERENTIAL/PLATELET
Basophils Absolute: 0.1 10*3/uL (ref 0.0–0.1)
Basophils Relative: 1.3 % (ref 0.0–3.0)
Eosinophils Absolute: 0.1 10*3/uL (ref 0.0–0.7)
Eosinophils Relative: 1.7 % (ref 0.0–5.0)
HCT: 43 % (ref 36.0–46.0)
Hemoglobin: 14.1 g/dL (ref 12.0–15.0)
Lymphocytes Relative: 40 % (ref 12.0–46.0)
Lymphs Abs: 3.3 10*3/uL (ref 0.7–4.0)
MCHC: 32.8 g/dL (ref 30.0–36.0)
MCV: 79.6 fl (ref 78.0–100.0)
Monocytes Absolute: 0.5 10*3/uL (ref 0.1–1.0)
Monocytes Relative: 6.3 % (ref 3.0–12.0)
Neutro Abs: 4.2 10*3/uL (ref 1.4–7.7)
Neutrophils Relative %: 50.7 % (ref 43.0–77.0)
Platelets: 250 10*3/uL (ref 150.0–400.0)
RBC: 5.4 Mil/uL — ABNORMAL HIGH (ref 3.87–5.11)
RDW: 14 % (ref 11.5–15.5)
WBC: 8.3 10*3/uL (ref 4.0–10.5)

## 2021-09-19 LAB — COMPREHENSIVE METABOLIC PANEL
ALT: 15 U/L (ref 0–35)
AST: 15 U/L (ref 0–37)
Albumin: 4.4 g/dL (ref 3.5–5.2)
Alkaline Phosphatase: 65 U/L (ref 39–117)
BUN: 9 mg/dL (ref 6–23)
CO2: 30 mEq/L (ref 19–32)
Calcium: 10.3 mg/dL (ref 8.4–10.5)
Chloride: 101 mEq/L (ref 96–112)
Creatinine, Ser: 1.08 mg/dL (ref 0.40–1.20)
GFR: 58.41 mL/min — ABNORMAL LOW (ref >60.00)
Glucose, Bld: 78 mg/dL (ref 70–99)
Potassium: 4 mEq/L (ref 3.5–5.1)
Sodium: 137 mEq/L (ref 135–145)
Total Bilirubin: 0.5 mg/dL (ref 0.2–1.2)
Total Protein: 7.1 g/dL (ref 6.0–8.3)

## 2021-09-19 LAB — LIPID PANEL
Cholesterol: 164 mg/dL (ref 0–200)
HDL: 63.3 mg/dL (ref >39.00)
LDL Cholesterol: 72 mg/dL (ref 0–99)
NonHDL: 100.42
Total CHOL/HDL Ratio: 3
Triglycerides: 141 mg/dL (ref 0.0–149.0)
VLDL: 28.2 mg/dL (ref 0.0–40.0)

## 2021-09-19 LAB — VITAMIN D 25 HYDROXY (VIT D DEFICIENCY, FRACTURES): VITD: 55.88 ng/mL (ref 30.00–100.00)

## 2021-10-04 ENCOUNTER — Ambulatory Visit
Admission: RE | Admit: 2021-10-04 | Discharge: 2021-10-04 | Disposition: A | Discharge status: Home / Self Care | Payer: Managed Care, Other (non HMO) | Source: Ambulatory Visit | Attending: Gynecology | Admitting: Gynecology

## 2021-10-04 DIAGNOSIS — Z1231 Encounter for screening mammogram for malignant neoplasm of breast: Secondary | ICD-10-CM

## 2021-10-29 ENCOUNTER — Encounter: Payer: Managed Care, Other (non HMO) | Admitting: Dietician

## 2021-11-07 ENCOUNTER — Other Ambulatory Visit: Payer: Self-pay

## 2021-11-07 ENCOUNTER — Encounter: Payer: Managed Care, Other (non HMO) | Attending: Physician Assistant | Admitting: Skilled Nursing Facility1

## 2021-11-07 ENCOUNTER — Encounter: Payer: Self-pay | Admitting: Skilled Nursing Facility1

## 2021-11-07 DIAGNOSIS — E669 Obesity, unspecified: Secondary | ICD-10-CM | POA: Insufficient documentation

## 2021-11-07 NOTE — Progress Notes (Signed)
Medical Nutrition Therapy   Primary concerns today: weight loss  Referral diagnosis: obesity Preferred learning style: auditory Learning readiness: contemplating   NUTRITION ASSESSMENT    Clinical Medical Hx: N/A Medications: estradiol Labs: A1C 6.1, RBC 5.40 Notable Signs/Symptoms: some dry mouth, foot pain, nightly interrupted sleep  Lifestyle & Dietary Hx  Pt states she has had hot flashes fr years and feels they are getting worse but since the estradiol is not as bad but suspect she is premenopausal.  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  Pt states due to foot surgery she has not been as active as she once was.  Pt states she started actively trying to lose weight: 2 years ago. Pt states she has been her current weight since about 1 full year having been about 209-210; stating she was about 185-187 8 years ago. Pt states she started to gain weight when she lost her job. Pt states since the last years she has been walking more, stopped eating meat.  Pt states she does not take a break at work.  Pt states she has dry mouth in the night. Pt states she keeps her grandson on the weekend and on saturdays goes and takes care of her dad.  Pt states her foot hurst sometimes and does prevent her from being as active as she wants so not being able to walk an hour or more.  Pt states she only eats out once a month.  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 Pt states she will not use a stationary bike and was bought a bike but then sold it.   According to pts current diet dietitian suspects imbalance of macronutrient distribution excessive fat and carbohydrate limited protein in addition to a sedentary lifestyle resulting in excess weight and difficulty in weight loss.    Estimated daily fluid intake: 48 oz Supplements: N/A Sleep: waking every hour to 2 hours to urinate  Stress / self-care: states no stress Current average weekly physical activity: 30 minutes 3 days a week  24-Hr Dietary Recall First Meal: 1 spinach and egg frozen muffin; smoothie: spinach, carrots, fruit, greek yogurt Snack: peanut butter crackers Second Meal: skipped Snack: skinny popcorn + 1 apple Third Meal: greens: veggie broth and butter; rice: wild rice; pinto beans  Snack: 1 apple or pear Beverages: 64 ounces water, gingerale (2 a week: 12 ounce can), cranberry (1 a week)  Estimated Energy Needs Calories: 1500   NUTRITION DIAGNOSIS  NI-5.7.1 Inadequate protein intake As related to uneducated pescaterian eating patterns.  As evidenced by 24 hr recall.   NUTRITION INTERVENTION  Nutrition education (E-1) on the following topics:  Macronutrient distribution  The need for each macronutrient and its role in the body Premenopausal and and its role in difficulty with weight loss Try edamame The importance of measuring food choices as the calorica intake can increase quickly if not  Handouts Provided Include  Detailed MyPlate marked specifically for pt inclusive of specific meal ideas   Learning Style & Readiness for Change Teaching method utilized: Visual & Auditory  Demonstrated degree of understanding via: Teach Back  Barriers to learning/adherence to lifestyle change: picky eater  Goals Established by Pt Keep your bottle with you to remember to drink over the weekend Start doing  your weights 5 days a week  Measure out your foods using the actual measuring cups Eat lunch daily Avoid cracker packs   MONITORING & EVALUATION Dietary intake, weekly physical activity  Next Steps  Patient is to return in 3 months.

## 2021-12-19 ENCOUNTER — Ambulatory Visit: Payer: Managed Care, Other (non HMO) | Admitting: Dietician

## 2022-01-08 ENCOUNTER — Ambulatory Visit: Payer: Managed Care, Other (non HMO) | Admitting: Skilled Nursing Facility1

## 2022-06-10 NOTE — Progress Notes (Signed)
Debra Silva is a 56 y.o. female here for a {New prob or follow up:31724}.  SCRIBE STATEMENT  History of Present Illness:   No chief complaint on file.   HPI Hearing Loss   Past Medical History:  Diagnosis Date   Arthritis    Migraine    hx of, resolved     Social History   Tobacco Use   Smoking status: Never   Smokeless tobacco: Never  Vaping Use   Vaping Use: Never used  Substance Use Topics   Alcohol use: No   Drug use: No    Past Surgical History:  Procedure Laterality Date   COLONOSCOPY     PARTIAL HYSTERECTOMY  2003    Family History  Problem Relation Age of Onset   Breast cancer Mother 54   Kidney disease Father    Stroke Father    Arthritis Father    Diabetes Maternal Grandmother        on insulin   ALS Maternal Grandmother 83   Hypertension Brother    Colon polyps Son    Colon cancer Neg Hx    Esophageal cancer Neg Hx    Rectal cancer Neg Hx    Stomach cancer Neg Hx     No Known Allergies  Current Medications:   Current Outpatient Medications:    Cholecalciferol (VITAMIN D) 2000 units tablet, Take 2,000 Units by mouth daily., Disp: , Rfl:    estradiol (CLIMARA - DOSED IN MG/24 HR) 0.0375 mg/24hr patch, Place 0.0375 mg onto the skin once a week., Disp: , Rfl:    Review of Systems:   ROS Negative unless otherwise specified per HPI.   Vitals:   There were no vitals filed for this visit.   There is no height or weight on file to calculate BMI.  Physical Exam:   Physical Exam  Assessment and Plan:   '@DIAGLIST'$ @    I,Savera Zaman,acting as a scribe for [employer], PA.,have documented all relevant documentation on the behalf of Inda Coke, PA,as directed by  Inda Coke, PA while in the presence of Inda Coke, [place].   ***  Inda Coke, PA-C

## 2022-06-11 ENCOUNTER — Encounter: Payer: Self-pay | Admitting: Physician Assistant

## 2022-06-11 ENCOUNTER — Ambulatory Visit: Payer: Managed Care, Other (non HMO) | Admitting: Physician Assistant

## 2022-06-11 VITALS — BP 130/86 | HR 87 | Temp 98.2°F | Ht 68.0 in | Wt 242.2 lb

## 2022-06-11 DIAGNOSIS — H6122 Impacted cerumen, left ear: Secondary | ICD-10-CM | POA: Diagnosis not present

## 2022-06-11 NOTE — Patient Instructions (Signed)
It was great to see you!  Consider over the counter debrox  May use cotton ball soaked in mineral oil into ear 10-20 min per week if having recurrent ear wax accumulation. May use dilute hydrogen peroxide (equal parts this and water) and place a few drops into ear every 2 weeks to help break down wax buildup.  Keep [place] posted if you need anything else.

## 2022-09-29 ENCOUNTER — Encounter: Payer: Managed Care, Other (non HMO) | Admitting: Physician Assistant

## 2022-10-24 ENCOUNTER — Encounter: Payer: Self-pay | Admitting: Physician Assistant

## 2022-10-24 ENCOUNTER — Other Ambulatory Visit (INDEPENDENT_AMBULATORY_CARE_PROVIDER_SITE_OTHER): Payer: Managed Care, Other (non HMO)

## 2022-10-24 ENCOUNTER — Ambulatory Visit (INDEPENDENT_AMBULATORY_CARE_PROVIDER_SITE_OTHER): Payer: Managed Care, Other (non HMO) | Admitting: Physician Assistant

## 2022-10-24 VITALS — BP 148/94 | HR 87 | Temp 97.1°F | Resp 16 | Ht 68.0 in | Wt 242.4 lb

## 2022-10-24 DIAGNOSIS — E88819 Insulin resistance, unspecified: Secondary | ICD-10-CM

## 2022-10-24 DIAGNOSIS — R030 Elevated blood-pressure reading, without diagnosis of hypertension: Secondary | ICD-10-CM

## 2022-10-24 DIAGNOSIS — E669 Obesity, unspecified: Secondary | ICD-10-CM | POA: Diagnosis not present

## 2022-10-24 DIAGNOSIS — Z0000 Encounter for general adult medical examination without abnormal findings: Secondary | ICD-10-CM

## 2022-10-24 LAB — COMPREHENSIVE METABOLIC PANEL
ALT: 15 U/L (ref 0–35)
AST: 13 U/L (ref 0–37)
Albumin: 4.1 g/dL (ref 3.5–5.2)
Alkaline Phosphatase: 68 U/L (ref 39–117)
BUN: 11 mg/dL (ref 6–23)
CO2: 30 mEq/L (ref 19–32)
Calcium: 10 mg/dL (ref 8.4–10.5)
Chloride: 103 mEq/L (ref 96–112)
Creatinine, Ser: 1.13 mg/dL (ref 0.40–1.20)
GFR: 54.89 mL/min — ABNORMAL LOW (ref >60.00)
Glucose, Bld: 104 mg/dL — ABNORMAL HIGH (ref 70–99)
Potassium: 4.3 mEq/L (ref 3.5–5.1)
Sodium: 139 mEq/L (ref 135–145)
Total Bilirubin: 0.4 mg/dL (ref 0.2–1.2)
Total Protein: 7 g/dL (ref 6.0–8.3)

## 2022-10-24 LAB — CBC WITH DIFFERENTIAL/PLATELET
Basophils Absolute: 0 10*3/uL (ref 0.0–0.1)
Basophils Relative: 0.6 % (ref 0.0–3.0)
Eosinophils Absolute: 0.1 10*3/uL (ref 0.0–0.7)
Eosinophils Relative: 0.9 % (ref 0.0–5.0)
HCT: 45.7 % (ref 36.0–46.0)
Hemoglobin: 14.9 g/dL (ref 12.0–15.0)
Lymphocytes Relative: 43.9 % (ref 12.0–46.0)
Lymphs Abs: 3.1 10*3/uL (ref 0.7–4.0)
MCHC: 32.7 g/dL (ref 30.0–36.0)
MCV: 79.7 fl (ref 78.0–100.0)
Monocytes Absolute: 0.5 10*3/uL (ref 0.1–1.0)
Monocytes Relative: 6.5 % (ref 3.0–12.0)
Neutro Abs: 3.4 10*3/uL (ref 1.4–7.7)
Neutrophils Relative %: 48.1 % (ref 43.0–77.0)
Platelets: 274 10*3/uL (ref 150.0–400.0)
RBC: 5.73 Mil/uL — ABNORMAL HIGH (ref 3.87–5.11)
RDW: 14 % (ref 11.5–15.5)
WBC: 7.1 10*3/uL (ref 4.0–10.5)

## 2022-10-24 LAB — LIPID PANEL
Cholesterol: 148 mg/dL (ref 0–200)
HDL: 61.7 mg/dL (ref >39.00)
LDL Cholesterol: 64 mg/dL (ref 0–99)
NonHDL: 86.04
Total CHOL/HDL Ratio: 2
Triglycerides: 109 mg/dL (ref 0.0–149.0)
VLDL: 21.8 mg/dL (ref 0.0–40.0)

## 2022-10-24 LAB — HEMOGLOBIN A1C: Hgb A1c MFr Bld: 6.4 % (ref 4.6–6.5)

## 2022-10-24 NOTE — Progress Notes (Signed)
Subjective:    Debra Silva is a 56 y.o. female and is here for a comprehensive physical exam.  HPI  Health Maintenance Due  Topic Date Due   Hepatitis C Screening  Never done   MAMMOGRAM  10/04/2022    Acute Concerns: Elevated blood pressure reading Currently taking no medication. At home blood pressure readings are: not checked. Patient denies chest pain, SOB, blurred vision, dizziness, unusual headaches, lower leg swelling. Denies excessive caffeine intake, stimulant usage, excessive alcohol intake, or increase in salt consumption.  BP Readings from Last 3 Encounters:  10/24/22 (!) 148/94  06/11/22 130/86  09/18/21 122/80   Chronic Issues: No chronic concerns discussed    Health Maintenance: Colonoscopy -- last completed on 08/09/2018. Mammogram -- last completed on 10/04/2021. She reports that she needs to make an appointment. PAP -- last completed on 10/17/2013. Diet -- She does not maintain a fair diet. Exercise -- She is not participating in regular exercise.   Mood -- She is in a stable mood today. Sleep- Patient expressed no concerns with sleeping  UTD with dentist? - She is UTD on dental care. UTD with eye doctor? - She is UTD on vision care.  Weight history: Wt Readings from Last 10 Encounters:  10/24/22 242 lb 6.4 oz (110 kg)  06/11/22 242 lb 4 oz (109.9 kg)  11/07/21 237 lb 1.6 oz (107.5 kg)  09/18/21 236 lb 8 oz (107.3 kg)  09/07/20 238 lb 3.2 oz (108 kg)  02/13/20 235 lb 3.2 oz (106.7 kg)  02/08/20 237 lb 6.4 oz (107.7 kg)  08/17/19 234 lb (106.1 kg)  08/09/18 226 lb (102.5 kg)  08/04/18 226 lb (102.5 kg)   Body mass index is 36.86 kg/m. No LMP recorded. Patient has had a hysterectomy.  Alcohol use:  reports no history of alcohol use.  Tobacco use:  Tobacco Use: Low Risk  (10/24/2022)   Patient History    Smoking Tobacco Use: Never    Smokeless Tobacco Use: Never    Passive Exposure: Not on file   Eligible for lung cancer screening?  No     10/24/2022    9:00 AM  Depression screen PHQ 2/9  Decreased Interest 0  Down, Depressed, Hopeless 0  PHQ - 2 Score 0     Other providers/specialists: Patient Care Team: Inda Coke, [place] as PCP - General (Physician Assistant) Pyrtle, Lajuan Lines, MD (Gastroenterology) Key, Nelia Shi, NP as Nurse Practitioner (Gynecology)    PMHx, SurgHx, SocialHx, Medications, and Allergies were reviewed in the Visit Navigator and updated as appropriate.   Past Medical History:  Diagnosis Date   Arthritis    Migraine    hx of, resolved     Past Surgical History:  Procedure Laterality Date   COLONOSCOPY     PARTIAL HYSTERECTOMY  2003     Family History  Problem Relation Age of Onset   Breast cancer Mother 69   Kidney disease Father    Stroke Father    Arthritis Father    Diabetes Maternal Grandmother        on insulin   ALS Maternal Grandmother 83   Hypertension Brother    Colon polyps Son    Colon cancer Neg Hx    Esophageal cancer Neg Hx    Rectal cancer Neg Hx    Stomach cancer Neg Hx     Social History   Tobacco Use   Smoking status: Never   Smokeless tobacco: Never  Vaping Use  Vaping Use: Never used  Substance Use Topics   Alcohol use: No   Drug use: No    Review of Systems:   Review of Systems  Constitutional:  Negative for chills, fever, malaise/fatigue and weight loss.  HENT:  Negative for hearing loss, sinus pain and sore throat.   Respiratory:  Negative for cough and hemoptysis.   Cardiovascular:  Negative for chest pain, palpitations, leg swelling and PND.  Gastrointestinal:  Negative for abdominal pain, constipation, diarrhea, heartburn, nausea and vomiting.  Genitourinary:  Negative for dysuria, frequency and urgency.  Musculoskeletal:  Negative for back pain, myalgias and neck pain.  Skin:  Negative for itching and rash.  Endo/Heme/Allergies:  Negative for polydipsia.  Psychiatric/Behavioral:  Negative for depression. The patient is not  nervous/anxious.     Objective:   BP (!) 148/94   Pulse 87   Temp (!) 97.1 F (36.2 C) (Temporal)   Resp 16   Ht '5\' 8"'$  (1.727 m)   Wt 242 lb 6.4 oz (110 kg)   SpO2 97%   BMI 36.86 kg/m  Body mass index is 36.86 kg/m.   General Appearance:    Alert, cooperative, no distress, appears stated age  Head:    Normocephalic, without obvious abnormality, atraumatic  Eyes:    PERRL, conjunctiva/corneas clear, EOM's intact, fundi    benign, both eyes  Ears:    Normal TM's and external ear canals, both ears  Nose:   Nares normal, septum midline, mucosa normal, no drainage    or sinus tenderness  Throat:   Lips, mucosa, and tongue normal; teeth and gums normal  Neck:   Supple, symmetrical, trachea midline, no adenopathy;    thyroid:  no enlargement/tenderness/nodules; no carotid   bruit or JVD  Back:     Symmetric, no curvature, ROM normal, no CVA tenderness  Lungs:     Clear to auscultation bilaterally, respirations unlabored  Chest Wall:    No tenderness or deformity   Heart:    Regular rate and rhythm, S1 and S2 normal, no murmur, rub or gallop  Breast Exam:    Deferred  Abdomen:     Soft, non-tender, bowel sounds active all four quadrants,    no masses, no organomegaly  Genitalia:    Deferred  Extremities:   Extremities normal, atraumatic, no cyanosis or edema  Pulses:   2+ and symmetric all extremities  Skin:   Skin color, texture, turgor normal, no rashes or lesions  Lymph nodes:   Cervical, supraclavicular, and axillary nodes normal  Neurologic:   CNII-XII intact, normal strength, sensation and reflexes    throughout    Assessment/Plan:   Routine physical examination Today patient counseled on age appropriate routine health concerns for screening and prevention, each reviewed and up to date or declined. Immunizations reviewed and up to date or declined. Labs ordered and reviewed. Risk factors for depression reviewed and negative. Hearing function and visual acuity are  intact. ADLs screened and addressed as needed. Functional ability and level of safety reviewed and appropriate. Education, counseling and referrals performed based on assessed risks today. Patient provided with a copy of personalized plan for preventive services.  Obesity (BMI 30-39.9) Recommend healthy lifestyle as able  Insulin resistance Update A1c and provide recommendations accordingly  Elevated blood pressure reading Above goal No evidence of end organ damage Recommend close monitoring at home and follow-up if readings are consistently > 140/90 or other concerns  I,Verona Buck,acting as a scribe for [employer], PA.,have documented  all relevant documentation on the behalf of Inda Coke, PA,as directed by  Inda Coke, PA while in the presence of Inda Coke, [place].  I, Inda Coke, [place], have reviewed all documentation for this visit. The documentation on 10/24/22 for the exam, diagnosis, procedures, and orders are all accurate and complete.  Inda Coke, PA-C [place]

## 2022-10-24 NOTE — Patient Instructions (Addendum)
It was great to see you!  You can either schedule blood work here for another day or go to another [facility] office for labs now. An order for labs has been put in for you. To get your labs today, you can walk in at the [facility] location without a scheduled appointment.  The address is [address] It is across the street from [facility] Lab is located in the basement.  Hours of operation are M-F 8:30am to 5:00pm. Please note that they are closed for lunch between 12:30 and 1:00pm. Please go to the lab for blood work.   Take care,  Aldona Bar

## 2022-10-31 ENCOUNTER — Encounter: Payer: Self-pay | Admitting: Physician Assistant

## 2022-10-31 ENCOUNTER — Telehealth: Payer: Self-pay | Admitting: Physician Assistant

## 2022-10-31 ENCOUNTER — Ambulatory Visit (INDEPENDENT_AMBULATORY_CARE_PROVIDER_SITE_OTHER): Payer: Managed Care, Other (non HMO) | Admitting: Physician Assistant

## 2022-10-31 VITALS — BP 150/72 | HR 100 | Temp 97.5°F | Ht 68.0 in | Wt 243.8 lb

## 2022-10-31 DIAGNOSIS — E88819 Insulin resistance, unspecified: Secondary | ICD-10-CM

## 2022-10-31 MED ORDER — ZEPBOUND 2.5 MG/0.5ML ~~LOC~~ SOAJ: 2.5000 mg | SUBCUTANEOUS | 0 refills | Status: DC

## 2022-10-31 NOTE — Progress Notes (Signed)
Debra Silva is a 56 y.o. female here for a follow up of a pre-existing problem.  History of Present Illness:   Chief Complaint  Patient presents with   Medication Refill    Pt has no questions or concerns regarding medication discussion     Medication Refill    Insulin resistance Has been to a nutritionist. Having a shake for breakfast and small lunch/dinner. She is trying to exercise regularly. Has a family member taking GLP-1 RA and she is interested in this Denies fam hx thryoid cancer    Past Medical History:  Diagnosis Date   Arthritis    Migraine    hx of, resolved     Social History   Tobacco Use   Smoking status: Never   Smokeless tobacco: Never  Vaping Use   Vaping Use: Never used  Substance Use Topics   Alcohol use: No   Drug use: No    Past Surgical History:  Procedure Laterality Date   COLONOSCOPY     PARTIAL HYSTERECTOMY  2003    Family History  Problem Relation Age of Onset   Breast cancer Mother 24   Kidney disease Father    Stroke Father    Arthritis Father    Diabetes Maternal Grandmother        on insulin   ALS Maternal Grandmother 83   Hypertension Brother    Colon polyps Son    Colon cancer Neg Hx    Esophageal cancer Neg Hx    Rectal cancer Neg Hx    Stomach cancer Neg Hx     No Known Allergies  Current Medications:   Current Outpatient Medications:    Cholecalciferol (VITAMIN D) 2000 units tablet, Take 2,000 Units by mouth daily., Disp: , Rfl:    estradiol (CLIMARA - DOSED IN MG/24 HR) 0.0375 mg/24hr patch, Place 0.0375 mg onto the skin once a week., Disp: , Rfl:    tirzepatide (ZEPBOUND) 2.5 MG/0.5ML Pen, Inject 2.5 mg into the skin once a week., Disp: 2 mL, Rfl: 0   Review of Systems:   ROS Negative unless otherwise specified per HPI.  Vitals:   Vitals:   10/31/22 0807  BP: (!) 150/72  Pulse: 100  Temp: (!) 97.5 F (36.4 C)  TempSrc: Temporal  SpO2: 95%  Weight: 243 lb 12.8 oz (110.6 kg)  Height: '5\' 8"'$   (1.727 m)     Body mass index is 37.07 kg/m.  Physical Exam:   Physical Exam Constitutional:      Appearance: Normal appearance. She is well-developed.  HENT:     Head: Normocephalic and atraumatic.  Eyes:     General: Lids are normal.     Extraocular Movements: Extraocular movements intact.     Conjunctiva/sclera: Conjunctivae normal.  Pulmonary:     Effort: Pulmonary effort is normal.  Musculoskeletal:        General: Normal range of motion.     Cervical back: Normal range of motion and neck supple.  Skin:    General: Skin is warm and dry.  Neurological:     Mental Status: She is alert and oriented to person, place, and time.  Psychiatric:        Attention and Perception: Attention and perception normal.        Mood and Affect: Mood normal.        Behavior: Behavior normal.        Thought Content: Thought content normal.        Judgment: Judgment  normal.     Assessment and Plan:   Insulin resistance A1c increasing Great GLP-1 RA candidate We will send in Zepbound 2.5 mg weekly  She would like to stay on lowest dose possible Agreeable to metformin if Zepbound is not covered Recommend follow-up in 1 month after starting injection to check in -- mychart message ok    Inda Coke, PA-C

## 2022-10-31 NOTE — Patient Instructions (Signed)
It was great to see you!  I will send in [place] for you Please work on reduced portions of pasta/rice  Let's follow-up in 1 month either through [place] or office visit to check in on your progress with this medication once you start it, sooner if you have concerns.   Take care,  Inda Coke PA-C

## 2022-11-10 ENCOUNTER — Other Ambulatory Visit: Payer: Self-pay | Admitting: Gynecology

## 2022-11-10 DIAGNOSIS — Z1231 Encounter for screening mammogram for malignant neoplasm of breast: Secondary | ICD-10-CM

## 2022-11-26 NOTE — Telephone Encounter (Signed)
Patient called to speak to either PCP or Butch Penny about medication approval or denial. Requests a callback.

## 2022-11-26 NOTE — Telephone Encounter (Signed)
Spoke to pt told her I have not received anything from the pharmacy regarding Zepbound. Told pt to contact the pharmacy and ask if it needs a PA if so have them send to [place] so we can do it. Pt verbalized understanding.

## 2022-11-27 NOTE — Telephone Encounter (Signed)
Please see message and advise 

## 2022-11-27 NOTE — Telephone Encounter (Signed)
Spoke to pt told her Aldona Bar said you will need to contact your insurance company to see if they cover any weight loss medications and then let us know. Pt verbalized understanding.

## 2022-12-29 ENCOUNTER — Ambulatory Visit
Admission: RE | Admit: 2022-12-29 | Discharge: 2022-12-29 | Disposition: A | Discharge status: Home / Self Care | Payer: Managed Care, Other (non HMO) | Source: Ambulatory Visit | Attending: Gynecology | Admitting: Gynecology

## 2022-12-29 DIAGNOSIS — Z1231 Encounter for screening mammogram for malignant neoplasm of breast: Secondary | ICD-10-CM

## 2022-12-31 ENCOUNTER — Encounter: Payer: Self-pay | Admitting: Radiology

## 2022-12-31 ENCOUNTER — Ambulatory Visit (INDEPENDENT_AMBULATORY_CARE_PROVIDER_SITE_OTHER): Payer: Managed Care, Other (non HMO) | Admitting: Radiology

## 2022-12-31 VITALS — BP 142/88 | Ht 67.5 in | Wt 245.0 lb

## 2022-12-31 DIAGNOSIS — Z79890 Hormone replacement therapy (postmenopausal): Secondary | ICD-10-CM

## 2022-12-31 DIAGNOSIS — Z01419 Encounter for gynecological examination (general) (routine) without abnormal findings: Secondary | ICD-10-CM | POA: Diagnosis not present

## 2022-12-31 MED ORDER — ESTRADIOL 0.0375 MG/24HR TD PTWK: 0.0375 mg | MEDICATED_PATCH | TRANSDERMAL | 4 refills | Status: DC

## 2022-12-31 NOTE — Progress Notes (Signed)
   Debra Silva 18-May-1966 [id number]   History:  57 y.o. G2P1 presents for annual exam.Desires refill on estrogen patch, doing well. No concerns.  Gynecologic History Hysterectomy: 2003 fibroids  Sexually active: yes partner x 20 years  Health Maintenance Last Pap: ?2018. Results were:  Last mammogram: 12/29/22. Results were: normal Last colonoscopy: 08/09/18. Results were: normal   Past medical history, past surgical history, family history and social history were all reviewed and documented in the EPIC chart.  ROS:  A ROS was performed and pertinent positives and negatives are included.  Exam:  Vitals:   12/31/22 1455 12/31/22 1500  BP: (!) 146/98 (!) 142/88  Weight: 245 lb (111.1 kg)   Height: 5' 7.5" (1.715 m)    Body mass index is 37.81 kg/m.  General appearance:  Normal, obese Thyroid:  Symmetrical, normal in size, without palpable masses or nodularity. Respiratory  Auscultation:  Clear without wheezing or rhonchi Cardiovascular  Auscultation:  Regular rate, without rubs, murmurs or gallops  Edema/varicosities:  Not grossly evident Abdominal  Soft,nontender, without masses, guarding or rebound.  Liver/spleen:  No organomegaly noted  Hernia:  None appreciated  Skin  Inspection:  Grossly normal Breasts: Examined lying and sitting.   Right: Without masses, retractions, nipple discharge or axillary adenopathy.   Left: Without masses, retractions, nipple discharge or axillary adenopathy. Genitourinary   Inguinal/mons:  Normal without inguinal adenopathy  External genitalia:  Normal appearing vulva with no masses, tenderness, or lesions  BUS/Urethra/Skene's glands:  Normal  Vagina:  Normal appearing with normal color and discharge, no lesions. Atrophy: none  Cervix:  absent  Uterus:  absent  Adnexa/parametria:     Rt: Normal in size, without masses or tenderness.   Lt: Normal in size, without masses or tenderness.  Anus and perineum: Normal    Patient  informed chaperone available to be present for breast and pelvic exam. Patient has requested no chaperone to be present. Patient has been advised what will be completed during breast and pelvic exam.   Assessment/Plan:   1. Well woman exam with routine gynecological exam Up to date on screenings  2. Hormone replacement therapy (HRT)  - estradiol (CLIMARA - DOSED IN MG/24 HR) 0.0375 mg/24hr patch; Place 1 patch (0.0375 mg total) onto the skin once a week.  Dispense: 12 patch; Refill: 4     Discussed SBE, colonoscopy and DEXA screening as appropriate. Encouraged 1110mns/week of cardiovascular and weight bearing exercise minimum. Recommend the use of seatbelts and sunscreen consistently.   Return in 1 year for annual or sooner prn.  CRubbie BattiestB WHNP-BC 3:37 PM 12/31/2022

## 2023-05-19 ENCOUNTER — Encounter: Payer: Self-pay | Admitting: Physician Assistant

## 2023-05-19 ENCOUNTER — Ambulatory Visit: Payer: Managed Care, Other (non HMO) | Admitting: Physician Assistant

## 2023-05-19 VITALS — BP 120/82 | HR 91 | Temp 98.0°F | Ht 67.5 in | Wt 242.4 lb

## 2023-05-19 DIAGNOSIS — M25561 Pain in right knee: Secondary | ICD-10-CM

## 2023-05-19 DIAGNOSIS — M545 Low back pain, unspecified: Secondary | ICD-10-CM

## 2023-05-19 DIAGNOSIS — G8929 Other chronic pain: Secondary | ICD-10-CM

## 2023-05-19 DIAGNOSIS — M25562 Pain in left knee: Secondary | ICD-10-CM | POA: Diagnosis not present

## 2023-05-19 NOTE — Patient Instructions (Signed)
It was great to see you!  We will attempt to order MRI's  [facility] [phone] I would expect them to call you, but if you do not hear from them in a week or so, give them a call  Take care,  [name] PA-C

## 2023-05-19 NOTE — Progress Notes (Signed)
Debra Silva is a 58 y.o. female here for a new problem.  History of Present Illness:   Chief Complaint  Patient presents with   Back Pain    Pt c/o left lower back pain x several months. Has not medication. Has tried epsom salt baths.   Knee Pain    Pt c/o bilateral knee pain L> R, left knee gave out last Sunday, was wearing a brace.   Back pain:  She complains of lower left back pain for the past couple months.  Her pain is worsening since her first developed it.  Her pain worsens when standing for long periods.  She has to stand long periods when working daily.  She denies her pain radiating down her leg.  She is not taking medication to manage her pain. She has tried an epsom salt bath to manage her pain.  She has a history of missing a lower lumbar disk.    Knee pain:  She complains of bilateral knee pain.  Her left knee is worse than her right.  Her left knee gave out last Sunday while wearing a knee brace.  She notes prior to her knee pain she was hanging up curtains which might have caused her pain.  She has seen another provider to manage her knee pain in 2021 and found she had arthritis in her right knee.   She is requesting to have an MRI in both knees.     Past Medical History:  Diagnosis Date   Arthritis    Migraine    hx of, resolved     Social History   Tobacco Use   Smoking status: Never    Passive exposure: Never   Smokeless tobacco: Never  Vaping Use   Vaping status: Never Used  Substance Use Topics   Alcohol use: No   Drug use: No    Past Surgical History:  Procedure Laterality Date   COLONOSCOPY     PARTIAL HYSTERECTOMY  2003    Family History  Problem Relation Age of Onset   Breast cancer Mother 68   Kidney disease Father    Stroke Father    Arthritis Father    Diabetes Maternal Grandmother        on insulin   ALS Maternal Grandmother 50   Hypertension Brother    Colon polyps Son    Colon cancer Neg Hx    Esophageal cancer  Neg Hx    Rectal cancer Neg Hx    Stomach cancer Neg Hx     No Known Allergies  Current Medications:   Current Outpatient Medications:    estradiol (CLIMARA - DOSED IN MG/24 HR) 0.0375 mg/24hr patch, Place 1 patch (0.0375 mg total) onto the skin once a week., Disp: 12 patch, Rfl: 4   Vitamin D, Cholecalciferol, 25 MCG (1000 UT) CAPS, Take 1 capsule by mouth daily in the afternoon., Disp: , Rfl:    Review of Systems:   Review of Systems  Musculoskeletal:  Positive for back pain (left lower back pain).       (+)bilateral knee pain (L>R)    Vitals:   Vitals:   05/19/23 1507  BP: 120/82  Pulse: 91  Temp: 98 F (36.7 C)  TempSrc: Temporal  SpO2: 94%  Weight: 242 lb 6.1 oz (109.9 kg)  Height: 5' 7.5" (1.715 m)     Body mass index is 37.4 kg/m.  Physical Exam:   Physical Exam Vitals and nursing note reviewed.  Constitutional:  General: She is not in acute distress.    Appearance: She is well-developed. She is not ill-appearing or toxic-appearing.  Cardiovascular:     Rate and Rhythm: Normal rate and regular rhythm.     Pulses: Normal pulses.     Heart sounds: Normal heart sounds, S1 normal and S2 normal.  Pulmonary:     Effort: Pulmonary effort is normal.     Breath sounds: Normal breath sounds.  Musculoskeletal:     Comments: Normal ROM Left knee with tenderness to palpation to lateral aspect with slight swelling No tenderness to palpation to right knee  Skin:    General: Skin is warm and dry.  Neurological:     Mental Status: She is alert.     GCS: GCS eye subscore is 4. GCS verbal subscore is 5. GCS motor subscore is 6.     Comments: 5/5 bilateral knee strength  Psychiatric:        Speech: Speech normal.        Behavior: Behavior normal. Behavior is cooperative.     Assessment and Plan:   Chronic left-sided low back pain, unspecified whether sciatica present Ongoing Has had prior xrays She is requesting MRI for further evaluation Recommend spine  specialist based on results  Chronic pain of both knees Patient is requesting MRI's Has had prior xrays of both knees Declines referral to sports medicine for further evaluation, has already been to sports medicine without improvement of symptom(s)  Low threshold to refer to orthopedics if any concerns with MRI or ongoing pain without explanation   I,Shehryar Baig,acting as a scribe for [employer], PA.,have documented all relevant documentation on the behalf of [name], PA,as directed by  [name], PA while in the presence of [name], [place].  I, [name], [place], have reviewed all documentation for this visit. The documentation on 05/19/23 for the exam, diagnosis, procedures, and orders are all accurate and complete.  [name], PA-C

## 2023-05-20 LAB — BASIC METABOLIC PANEL
BUN: 11 mg/dL (ref 6–23)
CO2: 27 mEq/L (ref 19–32)
Calcium: 10.2 mg/dL (ref 8.4–10.5)
Chloride: 104 mEq/L (ref 96–112)
Creatinine, Ser: 1.07 mg/dL (ref 0.40–1.20)
GFR: 58.37 mL/min — ABNORMAL LOW (ref >60.00)
Glucose, Bld: 99 mg/dL (ref 70–99)
Potassium: 4.3 mEq/L (ref 3.5–5.1)
Sodium: 140 mEq/L (ref 135–145)

## 2023-05-27 ENCOUNTER — Telehealth: Payer: Self-pay | Admitting: Physician Assistant

## 2023-05-27 NOTE — Telephone Encounter (Signed)
Spoke to pt asked her how can I help you? Pt said she received a letter from insurance stating they need more information for MRI's. Told her I am not sure what it means. I will send message to our referral coordinator and have her call you. Pt verbalized understanding.

## 2023-05-27 NOTE — Telephone Encounter (Signed)
Debra Silva, please call pt about MRI's scheduled.

## 2023-05-27 NOTE — Telephone Encounter (Signed)
Patient requests to be called re: MRI

## 2023-05-28 ENCOUNTER — Telehealth: Payer: Self-pay | Admitting: *Deleted

## 2023-05-28 DIAGNOSIS — G8929 Other chronic pain: Secondary | ICD-10-CM

## 2023-05-28 NOTE — Telephone Encounter (Signed)
Debra Silva, Debra  Lennon Silva, Debra Reader, LPN Please call patient and let her know that her MRI's have not been authorized due to the following:  Needs to have had: 6 weeks of treatment which can be steroids or steroid injection 6 weeks of medications 6 weeks of PT Prior imaging such as xray  At this point, I recommend referral to Sports Medicine to discuss this and take over her care to see if they can facilitate getting MRI's approved or addressing these needs.

## 2023-05-28 NOTE — Telephone Encounter (Signed)
Left message on voicemail to call office.  

## 2023-05-28 NOTE — Telephone Encounter (Signed)
Pt called back told her per Lelon Mast,  let you know that her MRI's have not been authorized due to the following:  Needs to have had: 6 weeks of treatment which can be steroids or steroid injection 6 weeks of medications 6 weeks of PT Prior imaging such as xray  At this point, I recommend referral to Sports Medicine to discuss this and take over her care to see if they can facilitate getting MRI's approved or addressing these needs. Pt verbalized understanding, agreed to Sports Medicine, but does not want to see who she saw before. Told her okay, will place referral with another provider and someone will contact you to schedule an appt. Pt verbalized understanding.

## 2023-05-30 ENCOUNTER — Other Ambulatory Visit: Payer: Managed Care, Other (non HMO)

## 2023-06-01 NOTE — Progress Notes (Unsigned)
[facility] [address] [place] [address] Phone: [phone] Subjective:   Bruce Donath, am serving as a scribe for Dr. Antoine Primas.  I'm seeing this patient by the request  of:  [name], [place]  CC: Bilateral knee and low back pain  [id number]  DIANN DEBES is a 57 y.o. female coming in with complaint of B knee and L side LBP. Pain does not radiate and is worse when standing.   L knee pain > R knee pain. Pain is constant in L knee. Pain can be sharp. Uses tiger balm and does Advil prn.   Previous x-rays in 2020 one of the right knee did show mild degenerative joint disease.    Past Medical History:  Diagnosis Date   Arthritis    Migraine    hx of, resolved   Past Surgical History:  Procedure Laterality Date   COLONOSCOPY     PARTIAL HYSTERECTOMY  2003   Social History   Socioeconomic History   Marital status: Single    Spouse name: Not on file   Number of children: 1   Years of education: Not on file   Highest education level: Not on file  Occupational History   Occupation: [employer]  Tobacco Use   Smoking status: Never    Passive exposure: Never   Smokeless tobacco: Never  Vaping Use   Vaping status: Never Used  Substance and Sexual Activity   Alcohol use: No   Drug use: No   Sexual activity: Yes    Partners: Male    Birth control/protection: Surgical    Comment: hysterectomy, menarche 57yo, sexual debut 57yo  Other Topics Concern   Not on file  Social History Narrative   Not on file   Social Determinants of Health   Financial Resource Strain: Not on file  Food Insecurity: Not on file  Transportation Needs: Not on file  Physical Activity: Not on file  Stress: Not on file  Social Connections: Not on file   No Known Allergies Family History  Problem Relation Age of Onset   Breast cancer Mother 88   Kidney disease Father    Stroke Father    Arthritis Father    Diabetes Maternal Grandmother         on insulin   ALS Maternal Grandmother 67   Hypertension Brother    Colon polyps Son    Colon cancer Neg Hx    Esophageal cancer Neg Hx    Rectal cancer Neg Hx    Stomach cancer Neg Hx     Current Outpatient Medications (Endocrine & Metabolic):    estradiol (CLIMARA - DOSED IN MG/24 HR) 0.0375 mg/24hr patch, Place 1 patch (0.0375 mg total) onto the skin once a week.      Current Outpatient Medications (Other):    Vitamin D, Cholecalciferol, 25 MCG (1000 UT) CAPS, Take 1 capsule by mouth daily in the afternoon.   Reviewed prior external information including notes and imaging from  primary care provider As well as notes that were available from care everywhere and other healthcare systems.  Past medical history, social, surgical and family history all reviewed in electronic medical record.  No pertanent information unless stated regarding to the chief complaint.   Review of Systems:  No headache, visual changes, nausea, vomiting, diarrhea, constipation, dizziness, abdominal pain, skin rash, fevers, chills, night sweats, weight loss, swollen lymph nodes, body aches, joint swelling, chest pain, shortness of breath, mood  changes. POSITIVE muscle aches  Objective  Blood pressure 112/86, pulse 98, height 5' 7.5" (1.715 m), weight 243 lb (110.2 kg), SpO2 97%.   General: No apparent distress alert and oriented x3 mood and affect normal, dressed appropriately.  HEENT: Pupils equal, extraocular movements intact  Respiratory: Patient's speak in full sentences and does not appear short of breath  Cardiovascular: No lower extremity edema, non tender, no erythema   Low back exam shows patient does have some mild loss of lordosis.  Tightness noted with FABER test bilaterally.  Patient does have some loss of lordosis.  Patient does have pain to even light palpation over the back.  Bilateral knee exam show crepitus noted.  Lateral tracking of the patella noted.  Patient does have  tenderness over the medial joint line as well somewhat.  Limited muscular skeletal ultrasound was performed and interpreted by Antoine Primas, M  Limited ultrasound of patient's patella femoral joint does show hypoechoic changes bilaterally with significant narrowing noted. \Medial and lateral joint spaces have some mild narrowing but nothing severe. Impression: Patellofemoral arthritis   Impression and Recommendations:     The above documentation has been reviewed and is accurate and complete Judi Saa, DO

## 2023-06-04 ENCOUNTER — Other Ambulatory Visit: Payer: Self-pay

## 2023-06-04 ENCOUNTER — Ambulatory Visit: Payer: Managed Care, Other (non HMO) | Admitting: Family Medicine

## 2023-06-04 ENCOUNTER — Ambulatory Visit: Payer: Managed Care, Other (non HMO)

## 2023-06-04 ENCOUNTER — Encounter: Payer: Self-pay | Admitting: Family Medicine

## 2023-06-04 VITALS — BP 112/86 | HR 98 | Ht 67.5 in | Wt 243.0 lb

## 2023-06-04 DIAGNOSIS — E669 Obesity, unspecified: Secondary | ICD-10-CM

## 2023-06-04 DIAGNOSIS — M1710 Unilateral primary osteoarthritis, unspecified knee: Secondary | ICD-10-CM | POA: Insufficient documentation

## 2023-06-04 DIAGNOSIS — G8929 Other chronic pain: Secondary | ICD-10-CM

## 2023-06-04 DIAGNOSIS — M25562 Pain in left knee: Secondary | ICD-10-CM

## 2023-06-04 DIAGNOSIS — M545 Low back pain, unspecified: Secondary | ICD-10-CM

## 2023-06-04 DIAGNOSIS — M25561 Pain in right knee: Secondary | ICD-10-CM

## 2023-06-04 NOTE — Assessment & Plan Note (Signed)
Patient does have intermittent sciatica previously.  This seems to be though multifactorial at this time.  Describes the pain as more of an intermittent pain with certain movements.  Some of it seems to be before she started having knee pain.  Patient wants to stay away from significant amount of medications if she can.  Wants to avoid any surgical intervention.  Discussed core strengthening exercises and work with athletic trainer to learn home exercises in greater detail.  We discussed which activities to do and which ones to avoid.  Increase activity slowly.  Follow-up with me again in 6 to 8 weeks otherwise.  If patient has any worsening radicular symptoms or no significant improvement advanced imaging could be potentially warranted.

## 2023-06-04 NOTE — Assessment & Plan Note (Signed)
Continue to work on weight loss 

## 2023-06-04 NOTE — Patient Instructions (Signed)
Xray today PF and low back pain exercises Voltaren gel Ice 20 min 2x a day Tart cherry extract 2400mg  at bedtime See me again in 7 -8 weeks

## 2023-06-04 NOTE — Assessment & Plan Note (Signed)
Significant arthritic changes of the patellofemoral joint bilaterally.  Discussed icing regimen and home exercises.  Discussed which activities to do and which ones to avoid.  Increase activity slowly.  Patient has a phobia of needles so wanted to hold on any type of injections at this point.  If patient worsens she will consider this.  Patient states that she is having some instability as well.  Follow-up with me again 6 to 8 weeks.

## 2023-07-27 NOTE — Progress Notes (Unsigned)
[facility] [address] [place] [address] Phone: [phone] Subjective:   INadine Counts, am serving as a scribe for Dr. Antoine Primas.  I'm seeing this patient by the request  of:  [name], [place]  CC: Bilateral knee pain  [id number]  06/04/2023 Significant arthritic changes of the patellofemoral joint bilaterally. Discussed icing regimen and home exercises. Discussed which activities to do and which ones to avoid. Increase activity slowly. Patient has a phobia of needles so wanted to hold on any type of injections at this point. If patient worsens she will consider this. Patient states that she is having some instability as well. Follow-up with me again 6 to 8 weeks.  Patient does have intermittent sciatica previously.  This seems to be though multifactorial at this time.  Describes the pain as more of an intermittent pain with certain movements.  Some of it seems to be before she started having knee pain.  Patient wants to stay away from significant amount of medications if she can.  Wants to avoid any surgical intervention.  Discussed core strengthening exercises and work with athletic trainer to learn home exercises in greater detail.  We discussed which activities to do and which ones to avoid.  Increase activity slowly.  Follow-up with me again in 6 to 8 weeks otherwise.  If patient has any worsening radicular symptoms or no significant improvement advanced imaging could be potentially warranted.     Continue to work on weight loss.     Updated 07/28/2023 MCKINSEY SHORB is a 57 y.o. female coming in with complaint of B knee pain. Doing well. Last week started bother her again. No new concerns.      Past Medical History:  Diagnosis Date   Arthritis    Migraine    hx of, resolved   Past Surgical History:  Procedure Laterality Date   COLONOSCOPY     PARTIAL HYSTERECTOMY  2003   Social History   Socioeconomic History   Marital  status: Single    Spouse name: Not on file   Number of children: 1   Years of education: Not on file   Highest education level: Not on file  Occupational History   Occupation: [employer]  Tobacco Use   Smoking status: Never    Passive exposure: Never   Smokeless tobacco: Never  Vaping Use   Vaping status: Never Used  Substance and Sexual Activity   Alcohol use: No   Drug use: No   Sexual activity: Yes    Partners: Male    Birth control/protection: Surgical    Comment: hysterectomy, menarche 57yo, sexual debut 57yo  Other Topics Concern   Not on file  Social History Narrative   Not on file   Social Determinants of Health   Financial Resource Strain: Not on file  Food Insecurity: Not on file  Transportation Needs: Not on file  Physical Activity: Not on file  Stress: Not on file  Social Connections: Not on file   No Known Allergies Family History  Problem Relation Age of Onset   Breast cancer Mother 11   Kidney disease Father    Stroke Father    Arthritis Father    Diabetes Maternal Grandmother        on insulin   ALS Maternal Grandmother 58   Hypertension Brother    Colon polyps Son    Colon cancer Neg Hx    Esophageal cancer Neg Hx  Rectal cancer Neg Hx    Stomach cancer Neg Hx     Current Outpatient Medications (Endocrine & Metabolic):    estradiol (CLIMARA - DOSED IN MG/24 HR) 0.0375 mg/24hr patch, Place 1 patch (0.0375 mg total) onto the skin once a week.    Current Outpatient Medications (Analgesics):    meloxicam (MOBIC) 15 MG tablet, Take 1 tablet (15 mg total) by mouth daily.   Current Outpatient Medications (Other):    Vitamin D, Cholecalciferol, 25 MCG (1000 UT) CAPS, Take 1 capsule by mouth daily in the afternoon.   Reviewed prior external information including notes and imaging from  primary care provider As well as notes that were available from care everywhere and other healthcare systems.  Past medical history, social, surgical and  family history all reviewed in electronic medical record.  No pertanent information unless stated regarding to the chief complaint.   Review of Systems:  No headache, visual changes, nausea, vomiting, diarrhea, constipation, dizziness, abdominal pain, skin rash, fevers, chills, night sweats, weight loss, swollen lymph nodes, body aches, joint swelling, chest pain, shortness of breath, mood changes. POSITIVE muscle aches  Objective  Blood pressure (!) 134/90, pulse 95, height 5\' 7"  (1.702 m), weight 241 lb (109.3 kg), SpO2 96%.   General: No apparent distress alert and oriented x3 mood and affect normal, dressed appropriately.  HEENT: Pupils equal, extraocular movements intact  Respiratory: Patient's speak in full sentences and does not appear short of breath  Cardiovascular: No lower extremity edema, non tender, no erythema  Knees do have crepitus noted.  Left greater than right.  Does have trace effusion noted to left knee.  Does have some tenderness with palpation as well in the left side.    Impression and Recommendations:    The above documentation has been reviewed and is accurate and complete Judi Saa, DO

## 2023-07-28 ENCOUNTER — Encounter: Payer: Self-pay | Admitting: Family Medicine

## 2023-07-28 ENCOUNTER — Ambulatory Visit: Payer: Managed Care, Other (non HMO) | Admitting: Family Medicine

## 2023-07-28 VITALS — BP 134/90 | HR 95 | Ht 67.0 in | Wt 241.0 lb

## 2023-07-28 DIAGNOSIS — M1710 Unilateral primary osteoarthritis, unspecified knee: Secondary | ICD-10-CM

## 2023-07-28 DIAGNOSIS — M2550 Pain in unspecified joint: Secondary | ICD-10-CM | POA: Diagnosis not present

## 2023-07-28 LAB — CBC WITH DIFFERENTIAL/PLATELET
Basophils Absolute: 0.1 10*3/uL (ref 0.0–0.1)
Basophils Relative: 0.6 % (ref 0.0–3.0)
Eosinophils Absolute: 0.1 10*3/uL (ref 0.0–0.7)
Eosinophils Relative: 1.1 % (ref 0.0–5.0)
HCT: 43.5 % (ref 36.0–46.0)
Hemoglobin: 13.9 g/dL (ref 12.0–15.0)
Lymphocytes Relative: 35.8 % (ref 12.0–46.0)
Lymphs Abs: 3.2 10*3/uL (ref 0.7–4.0)
MCHC: 31.8 g/dL (ref 30.0–36.0)
MCV: 80.6 fl (ref 78.0–100.0)
Monocytes Absolute: 0.5 10*3/uL (ref 0.1–1.0)
Monocytes Relative: 5.7 % (ref 3.0–12.0)
Neutro Abs: 5.1 10*3/uL (ref 1.4–7.7)
Neutrophils Relative %: 56.8 % (ref 43.0–77.0)
Platelets: 292 10*3/uL (ref 150.0–400.0)
RBC: 5.4 Mil/uL — ABNORMAL HIGH (ref 3.87–5.11)
RDW: 14.7 % (ref 11.5–15.5)
WBC: 9 10*3/uL (ref 4.0–10.5)

## 2023-07-28 LAB — COMPREHENSIVE METABOLIC PANEL
ALT: 12 U/L (ref 0–35)
AST: 14 U/L (ref 0–37)
Albumin: 4.2 g/dL (ref 3.5–5.2)
Alkaline Phosphatase: 70 U/L (ref 39–117)
BUN: 12 mg/dL (ref 6–23)
CO2: 32 mEq/L (ref 19–32)
Calcium: 10.3 mg/dL (ref 8.4–10.5)
Chloride: 102 mEq/L (ref 96–112)
Creatinine, Ser: 1.08 mg/dL (ref 0.40–1.20)
GFR: 57.65 mL/min — ABNORMAL LOW (ref >60.00)
Glucose, Bld: 98 mg/dL (ref 70–99)
Potassium: 4 mEq/L (ref 3.5–5.1)
Sodium: 140 mEq/L (ref 135–145)
Total Bilirubin: 0.4 mg/dL (ref 0.2–1.2)
Total Protein: 7.1 g/dL (ref 6.0–8.3)

## 2023-07-28 LAB — IBC PANEL
Iron: 65 ug/dL (ref 42–145)
Saturation Ratios: 17.3 % — ABNORMAL LOW (ref 20.0–50.0)
TIBC: 376.6 ug/dL (ref 250.0–450.0)
Transferrin: 269 mg/dL (ref 212.0–360.0)

## 2023-07-28 LAB — VITAMIN B12: Vitamin B-12: 563 pg/mL (ref 211–911)

## 2023-07-28 LAB — C-REACTIVE PROTEIN: CRP: <1 mg/dL (ref 0.5–20.0)

## 2023-07-28 LAB — FERRITIN: Ferritin: 41.6 ng/mL (ref 10.0–291.0)

## 2023-07-28 LAB — VITAMIN D 25 HYDROXY (VIT D DEFICIENCY, FRACTURES): VITD: 32.2 ng/mL (ref 30.00–100.00)

## 2023-07-28 LAB — TSH: TSH: 2.15 u[IU]/mL (ref 0.35–5.50)

## 2023-07-28 LAB — URIC ACID: Uric Acid, Serum: 5.6 mg/dL (ref 2.4–7.0)

## 2023-07-28 LAB — SEDIMENTATION RATE: Sed Rate: 23 mm/hr (ref 0–30)

## 2023-07-28 MED ORDER — MELOXICAM 15 MG PO TABS: 15.0000 mg | ORAL_TABLET | Freq: Every day | ORAL | 0 refills | Status: DC

## 2023-07-28 NOTE — Assessment & Plan Note (Signed)
Significant patellofemoral arthritis.  Still wants to avoid any injection.  Discussed topical and given some meloxicam to try at a short dose.  Will get laboratory workup to make sure nothing else is contributing.  Continue to work on weight loss.  Discussed icing regimen.  Follow-up again in 12 weeks.

## 2023-07-28 NOTE — Patient Instructions (Addendum)
Labs today Meloxicam 15mg  for 10 days then as needed Iron 18mg  with 500mg  Vit C daily Arnica topical can help knees Continue to stay active See you again in 2-3 months

## 2023-07-29 LAB — PTH, INTACT AND CALCIUM
Calcium: 10.5 mg/dL — ABNORMAL HIGH (ref 8.6–10.4)
PTH: 66 pg/mL (ref 16–77)

## 2023-07-31 LAB — LIPID PANEL
Cholesterol: 166 mg/dL (ref <200)
HDL: 62 mg/dL (ref >=50)
LDL Cholesterol (Calc): 77 mg/dL
Non-HDL Cholesterol (Calc): 104 mg/dL (ref <130)
Total CHOL/HDL Ratio: 2.7 (calc) (ref <5.0)
Triglycerides: 167 mg/dL — ABNORMAL HIGH (ref <150)

## 2023-07-31 LAB — CYCLIC CITRUL PEPTIDE ANTIBODY, IGG: Cyclic Citrullin Peptide Ab: <16 U

## 2023-07-31 LAB — CALCIUM, IONIZED: Calcium, Ion: 5.4 mg/dL (ref 4.7–5.5)

## 2023-07-31 LAB — ANGIOTENSIN CONVERTING ENZYME: Angiotensin-Converting Enzyme: 58 U/L (ref 9–67)

## 2023-07-31 LAB — ANTI-NUCLEAR AB-TITER (ANA TITER): ANA Titer 1: 1:40 {titer} — ABNORMAL HIGH

## 2023-07-31 LAB — RHEUMATOID FACTOR: Rheumatoid fact SerPl-aCnc: <10 [IU]/mL (ref <14)

## 2023-07-31 LAB — ANA: Anti Nuclear Antibody (ANA): POSITIVE — AB

## 2023-08-26 ENCOUNTER — Other Ambulatory Visit: Payer: Self-pay | Admitting: Family Medicine

## 2023-12-22 ENCOUNTER — Ambulatory Visit: Payer: Managed Care, Other (non HMO) | Admitting: Physician Assistant

## 2023-12-22 ENCOUNTER — Encounter: Payer: Self-pay | Admitting: Physician Assistant

## 2023-12-22 VITALS — BP 118/74 | HR 87 | Temp 98.0°F | Ht 67.0 in | Wt 238.4 lb

## 2023-12-22 DIAGNOSIS — Z0000 Encounter for general adult medical examination without abnormal findings: Secondary | ICD-10-CM | POA: Diagnosis not present

## 2023-12-22 DIAGNOSIS — E669 Obesity, unspecified: Secondary | ICD-10-CM

## 2023-12-22 DIAGNOSIS — Z23 Encounter for immunization: Secondary | ICD-10-CM | POA: Diagnosis not present

## 2023-12-22 NOTE — Patient Instructions (Addendum)
 Debra Silva

## 2023-12-22 NOTE — Progress Notes (Signed)
 Subjective:    Debra Silva is a 58 y.o. female and is here for a comprehensive physical exam.  HPI  There are no preventive care reminders to display for this patient.   Acute Concerns: None  Chronic Issues: None  Health Maintenance: Immunizations -- N/A Colonoscopy -- Last done on 08-09-18. Recall in 10 years.  Mammogram -- last done on 12-29-22. Normal  PAP -- last done on 11-29-15. Normal  Bone Density -- N/A Diet -- eating well Exercise -- occasional knee stiffness from arthritis prevents her from regular exercise  Sleep habits -- no major issues, will wear estrogen patch in summer so she has better control of night sweats/hot flashes (not issue for her in summer) Mood -- Overall well controlled  UTD with dentist? - yes UTD with eye doctor? - yes  Weight history: Wt Readings from Last 10 Encounters:  12/22/23 238 lb 6.1 oz (108.1 kg)  07/28/23 241 lb (109.3 kg)  06/04/23 243 lb (110.2 kg)  05/19/23 242 lb 6.1 oz (109.9 kg)  12/31/22 245 lb (111.1 kg)  10/31/22 243 lb 12.8 oz (110.6 kg)  10/24/22 242 lb 6.4 oz (110 kg)  06/11/22 242 lb 4 oz (109.9 kg)  11/07/21 237 lb 1.6 oz (107.5 kg)  09/18/21 236 lb 8 oz (107.3 kg)   Body mass index is 37.34 kg/m. No LMP recorded. Patient has had a hysterectomy.  Alcohol use:  reports no history of alcohol use.  Tobacco use:  Tobacco Use: Low Risk  (12/22/2023)   Patient History    Smoking Tobacco Use: Never    Smokeless Tobacco Use: Never    Passive Exposure: Never   Eligible for lung cancer screening? no     12/22/2023    3:05 PM  Depression screen PHQ 2/9  Decreased Interest 0  Down, Depressed, Hopeless 0  PHQ - 2 Score 0     Other providers/specialists: Patient Care Team: [name], [place] as PCP - General (Physician Assistant) Pyrtle, Carie Caddy, MD (Gastroenterology) Key, Verita Schneiders, NP as Nurse Practitioner (Gynecology) Tanda Rockers, NP as Nurse Practitioner (Radiology)    PMHx, SurgHx,  SocialHx, Medications, and Allergies were reviewed in the Visit Navigator and updated as appropriate.   Past Medical History:  Diagnosis Date   Arthritis    Migraine    hx of, resolved     Past Surgical History:  Procedure Laterality Date   COLONOSCOPY     PARTIAL HYSTERECTOMY  2003     Family History  Problem Relation Age of Onset   Breast cancer Mother 62   Kidney disease Father    Stroke Father    Arthritis Father    Diabetes Maternal Grandmother        on insulin   ALS Maternal Grandmother 9   Hypertension Brother    Colon polyps Son    Colon cancer Neg Hx    Esophageal cancer Neg Hx    Rectal cancer Neg Hx    Stomach cancer Neg Hx     Social History   Tobacco Use   Smoking status: Never    Passive exposure: Never   Smokeless tobacco: Never  Vaping Use   Vaping status: Never Used  Substance Use Topics   Alcohol use: No   Drug use: No    Review of Systems:   Review of Systems  Constitutional:  Negative for chills, fever, malaise/fatigue and weight loss.  HENT:  Negative for hearing loss, sinus pain and sore throat.  Respiratory:  Negative for cough and hemoptysis.   Cardiovascular:  Negative for chest pain, palpitations, leg swelling and PND.  Gastrointestinal:  Negative for abdominal pain, constipation, diarrhea, heartburn, nausea and vomiting.  Genitourinary:  Negative for dysuria, frequency and urgency.  Musculoskeletal:  Negative for back pain, myalgias and neck pain.  Skin:  Negative for itching and rash.  Neurological:  Negative for dizziness, tingling, seizures and headaches.  Endo/Heme/Allergies:  Negative for polydipsia.  Psychiatric/Behavioral:  Negative for depression. The patient is not nervous/anxious.     Objective:   BP 118/74 (BP Location: Left Arm, Patient Position: Sitting, Cuff Size: Large)   Pulse 87   Temp 98 F (36.7 C) (Temporal)   Ht 5\' 7"  (1.702 m)   Wt 238 lb 6.1 oz (108.1 kg)   SpO2 96%   BMI 37.34 kg/m  Body  mass index is 37.34 kg/m.   General Appearance:    Alert, cooperative, no distress, appears stated age  Head:    Normocephalic, without obvious abnormality, atraumatic  Eyes:    PERRL, conjunctiva/corneas clear, EOM's intact, fundi    benign, both eyes  Ears:    Normal TM's and external ear canals, both ears  Nose:   Nares normal, septum midline, mucosa normal, no drainage    or sinus tenderness  Throat:   Lips, mucosa, and tongue normal; teeth and gums normal  Neck:   Supple, symmetrical, trachea midline, no adenopathy;    thyroid:  no enlargement/tenderness/nodules; no carotid   bruit or JVD  Back:     Symmetric, no curvature, ROM normal, no CVA tenderness  Lungs:     Clear to auscultation bilaterally, respirations unlabored  Chest Wall:    No tenderness or deformity   Heart:    Regular rate and rhythm, S1 and S2 normal, no murmur, rub or gallop  Breast Exam:    Deferred   Abdomen:     Soft, non-tender, bowel sounds active all four quadrants,    no masses, no organomegaly  Genitalia:    Deferred   Extremities:   Extremities normal, atraumatic, no cyanosis or edema  Pulses:   2+ and symmetric all extremities  Skin:   Skin color, texture, turgor normal, no rashes or lesions  Lymph nodes:   Cervical, supraclavicular, and axillary nodes normal  Neurologic:   CNII-XII intact, normal strength, sensation and reflexes    throughout    Assessment/Plan:   Routine physical examination Today patient counseled on age appropriate routine health concerns for screening and prevention, each reviewed and up to date or declined. Immunizations reviewed and up to date or declined. Labs ordered and reviewed. Risk factors for depression reviewed and negative. Hearing function and visual acuity are intact. ADLs screened and addressed as needed. Functional ability and level of safety reviewed and appropriate. Education, counseling and referrals performed based on assessed risks today. Patient provided with  a copy of personalized plan for preventive services.  Need for prophylactic vaccination with combined diphtheria-tetanus-pertussis (DTP) vaccine Updated today  Obesity (BMI 30-39.9) Continue efforts at healthy lifestyle    [name], PA-C [facility]

## 2024-01-06 ENCOUNTER — Other Ambulatory Visit: Payer: Self-pay | Admitting: Gynecology

## 2024-01-06 DIAGNOSIS — Z0000 Encounter for general adult medical examination without abnormal findings: Secondary | ICD-10-CM

## 2024-01-12 ENCOUNTER — Other Ambulatory Visit: Payer: Self-pay | Admitting: Gynecology

## 2024-01-12 ENCOUNTER — Ambulatory Visit
Admission: RE | Admit: 2024-01-12 | Discharge: 2024-01-12 | Disposition: A | Discharge status: Home / Self Care | Source: Ambulatory Visit | Attending: Gynecology | Admitting: Gynecology

## 2024-01-12 DIAGNOSIS — R2231 Localized swelling, mass and lump, right upper limb: Secondary | ICD-10-CM

## 2024-01-12 DIAGNOSIS — Z0000 Encounter for general adult medical examination without abnormal findings: Secondary | ICD-10-CM

## 2024-01-13 ENCOUNTER — Encounter: Payer: Self-pay | Admitting: Gynecology

## 2024-01-13 DIAGNOSIS — R2231 Localized swelling, mass and lump, right upper limb: Secondary | ICD-10-CM

## 2024-01-22 ENCOUNTER — Other Ambulatory Visit: Payer: Self-pay | Admitting: Radiology

## 2024-01-22 DIAGNOSIS — Z79890 Hormone replacement therapy (postmenopausal): Secondary | ICD-10-CM

## 2024-01-22 NOTE — Telephone Encounter (Signed)
 Med refill request: estradiol 0.0375 mg Last AEX: 12/31/22 Next AEX: none scheduled  Last MMG (if hormonal med) 12/29/22 BI-RADS 1 negative Refill authorized: estradiol 0.0375 mg, needs appointment Please approve or deny as appropriate.

## 2024-02-12 ENCOUNTER — Ambulatory Visit
Admission: RE | Admit: 2024-02-12 | Discharge: 2024-02-12 | Disposition: A | Discharge status: Home / Self Care | Source: Ambulatory Visit | Attending: Gynecology | Admitting: Gynecology

## 2024-02-12 DIAGNOSIS — R2231 Localized swelling, mass and lump, right upper limb: Secondary | ICD-10-CM

## 2024-04-18 NOTE — Progress Notes (Unsigned)
 [name] [address] [place] [address] Phone: [phone] Subjective:   Debra Silva, am serving as a scribe for Dr. Ronnell Coins.  I'm seeing this patient by the request  of:  Debra Silva, [place]  CC: Bilateral knee pain and back pain  KGM:[id number]  07/08/2023 Significant patellofemoral arthritis.  Still wants to avoid any injection.  Discussed topical and given some meloxicam  to try at a short dose.  Will get laboratory workup to make sure nothing else is contributing.  Continue to work on weight loss.  Discussed icing regimen.  Follow-up again in 12 weeks.     Updated 04/19/2024 Debra Silva is a 58 y.o. female coming in with complaint of L knee and back pain. Patient states that she is weak and pain is constant below the patella. No new injury. Knee pain at night will wake her up and she cannot fall asleep.   Standing for prolonged periods increases L sided lumbar spine pain. Denies any radiating symptoms. Pain is worsening over time. Works on [employer] all day.    Patient did have x-rays at last exam that did show moderate arthritic changes of the knees bilaterally.  Moderate degenerative disc disease of the lumbar spine also noted.    Past Medical History:  Diagnosis Date   Arthritis    Migraine    hx of, resolved   Past Surgical History:  Procedure Laterality Date   COLONOSCOPY     PARTIAL HYSTERECTOMY  2003   Social History   Socioeconomic History   Marital status: Single    Spouse name: Not on file   Number of children: 1   Years of education: Not on file   Highest education level: Not on file  Occupational History   Occupation: [employer]  Tobacco Use   Smoking status: Never    Passive exposure: Never   Smokeless tobacco: Never  Vaping Use   Vaping status: Never Used  Substance and Sexual Activity   Alcohol use: No   Drug use: No   Sexual activity: Yes    Partners: Male    Birth control/protection: Surgical     Comment: hysterectomy, menarche 58yo, sexual debut 58yo  Other Topics Concern   Not on file  Social History Narrative   Not on file   Social Drivers of Health   Financial Resource Strain: Not on file  Food Insecurity: Not on file  Transportation Needs: Not on file  Physical Activity: Not on file  Stress: Not on file  Social Connections: Not on file   No Known Allergies Family History  Problem Relation Age of Onset   Breast cancer Mother 27   Kidney disease Father    Stroke Father    Arthritis Father    Diabetes Maternal Grandmother        on insulin   ALS Maternal Grandmother 4   Hypertension Brother    Colon polyps Son    Colon cancer Neg Hx    Esophageal cancer Neg Hx    Rectal cancer Neg Hx    Stomach cancer Neg Hx     Current Outpatient Medications (Endocrine & Metabolic):    estradiol  (VIVELLE -DOT) 0.05 MG/24HR patch, Place 1 patch onto the skin 2 (two) times a week.      Current Outpatient Medications (Other):    Vitamin D , Cholecalciferol, 25 MCG (1000 UT) CAPS, Take 1 capsule by mouth daily in the afternoon.   Reviewed prior external information including  notes and imaging from  primary care provider As well as notes that were available from care everywhere and other healthcare systems.  Past medical history, social, surgical and family history all reviewed in electronic medical record.  No pertanent information unless stated regarding to the chief complaint.   Review of Systems:  No headache, visual changes, nausea, vomiting, diarrhea, constipation, dizziness, abdominal pain, skin rash, fevers, chills, night sweats, weight loss, swollen lymph nodes, body aches, joint swelling, chest pain, shortness of breath, mood changes. POSITIVE muscle aches  Objective  Blood pressure 120/88, pulse 90, height 5' 7 (1.702 m), weight 242 lb (109.8 kg), SpO2 96%.   General: No apparent distress alert and oriented x3 mood and affect normal, dressed appropriately.   HEENT: Pupils equal, extraocular movements intact  Respiratory: Patient's speak in full sentences and does not appear short of breath  Cardiovascular: No lower extremity edema, non tender, no erythema  Patient's knees bilaterally do have effusion noted.  Significant arthritic changes noted.  Instability noted. Low back does have some loss of lordosis but negative Spurling's.  Neurovascularly intact distally.  Able to get up from a seated position without any significant difficulty.  After informed written and verbal consent, patient was seated on exam table. Right knee was prepped with alcohol swab and utilizing anterolateral approach, patient's right knee space was injected with 4:1  marcaine 0.5%: Kenalog 40mg /dL. Patient tolerated the procedure well without immediate complications.  After informed written and verbal consent, patient was seated on exam table. Left knee was prepped with alcohol swab and utilizing anterolateral approach, patient's left knee space was injected with 4:1  marcaine 0.5%: Kenalog 40mg /dL. Patient tolerated the procedure well without immediate complications.   Impression and Recommendations:     The above documentation has been reviewed and is accurate and complete Debra Pasqua M Tonea Leiphart, DO

## 2024-04-19 ENCOUNTER — Telehealth: Payer: Self-pay

## 2024-04-19 ENCOUNTER — Ambulatory Visit: Admitting: Family Medicine

## 2024-04-19 ENCOUNTER — Encounter: Payer: Self-pay | Admitting: Family Medicine

## 2024-04-19 VITALS — BP 120/88 | HR 90 | Ht 67.0 in | Wt 242.0 lb

## 2024-04-19 DIAGNOSIS — M170 Bilateral primary osteoarthritis of knee: Secondary | ICD-10-CM | POA: Diagnosis not present

## 2024-04-19 DIAGNOSIS — M1710 Unilateral primary osteoarthritis, unspecified knee: Secondary | ICD-10-CM

## 2024-04-19 NOTE — Telephone Encounter (Signed)
 Patient ran for Monovisc for bilateral knees on 04/19/24. Case #: [id number]. Pending approval.

## 2024-04-19 NOTE — Telephone Encounter (Signed)
-----   Message from Debra Silva sent at 04/19/2024  3:12 PM EDT ----- Regarding: visco Can you please run patient for visco injections for both knees?  Thanks

## 2024-04-19 NOTE — Patient Instructions (Signed)
 Injected knees Read Cymbalta Lumos See me in 2-3 months

## 2024-04-19 NOTE — Assessment & Plan Note (Signed)
 Bilateral injections given, discussed icing regimen and home exercises.  Discussed that patient could be a candidate for viscosupplementation as well.  Increase activity slowly.  Follow-up again in 6 to 8 weeks

## 2024-04-20 NOTE — Telephone Encounter (Signed)
 Medication changed to Durolane as that is the preferred medication by patients insurance. I have ran the pre certification through CoverMyMeds  ([employer]) - [id number]

## 2024-12-26 ENCOUNTER — Encounter: Admitting: Physician Assistant
